# Patient Record
Sex: Male | Born: 1990
Health system: Southern US, Community
[De-identification: ages and names within clinical notes are randomized; demographics above are authoritative.]

## PROBLEM LIST (undated history)

## (undated) DIAGNOSIS — L270 Generalized skin eruption due to drugs and medicaments taken internally: Secondary | ICD-10-CM

## (undated) DIAGNOSIS — G4730 Sleep apnea, unspecified: Secondary | ICD-10-CM

## (undated) DIAGNOSIS — J3501 Chronic tonsillitis: Secondary | ICD-10-CM

## (undated) DIAGNOSIS — K219 Gastro-esophageal reflux disease without esophagitis: Secondary | ICD-10-CM

## (undated) HISTORY — PX: HERNIA REPAIR: SHX51

## (undated) HISTORY — PX: OTHER SURGICAL HISTORY: SHX169

---
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
## 2008-03-27 ENCOUNTER — Encounter (INDEPENDENT_AMBULATORY_CARE_PROVIDER_SITE_OTHER): Payer: Self-pay | Admitting: Family Medicine

## 2010-02-14 ENCOUNTER — Encounter: Payer: Self-pay | Admitting: Orthopedic Surgery

## 2010-02-14 ENCOUNTER — Encounter: Payer: Self-pay | Admitting: Otolaryngology

## 2011-02-28 ENCOUNTER — Emergency Department (HOSPITAL_COMMUNITY)
Admission: EM | Admit: 2011-02-28 | Discharge: 2011-02-28 | Disposition: A | Discharge status: Home / Self Care | Payer: Self-pay | Source: Home / Self Care | Attending: Emergency Medicine | Admitting: Emergency Medicine

## 2011-02-28 ENCOUNTER — Encounter (HOSPITAL_COMMUNITY): Payer: Self-pay | Admitting: Emergency Medicine

## 2011-02-28 DIAGNOSIS — A630 Anogenital (venereal) warts: Secondary | ICD-10-CM

## 2011-02-28 DIAGNOSIS — R5383 Other fatigue: Secondary | ICD-10-CM

## 2011-02-28 DIAGNOSIS — R5381 Other malaise: Secondary | ICD-10-CM

## 2011-02-28 HISTORY — DX: Morbid (severe) obesity due to excess calories: E66.01

## 2011-02-28 LAB — POCT I-STAT, CHEM 8
BUN: 15 mg/dL (ref 6–23)
Calcium, Ion: 1.16 mmol/L (ref 1.12–1.32)
Chloride: 103 mEq/L (ref 96–112)
Glucose, Bld: 92 mg/dL (ref 70–99)
Potassium: 4.3 mEq/L (ref 3.5–5.1)

## 2011-02-28 MED ORDER — PODOFILOX 0.5 % EX SOLN: CUTANEOUS | Status: DC

## 2011-02-28 NOTE — ED Provider Notes (Signed)
Chief Complaint  Patient presents with  . Dizziness  . Headache    History of Present Illness:   The patient has had a one-week history of headache, fatigue, weakness, dizziness, and nausea. Six months ago his father was diagnosed with cirrhosis of the liver. At that point he became concerned about his weight. He is morbidly obese and weighs over 300 pounds. He's been on a diet ever since then but has only managed to lose 25 pounds. He thought the weight wasn't coming off fast enough, so about a week ago he decided to go on a juice diet consisting only fruits and vegetable juices. He did not eat any meats or carbohydrates at all. Thereafter he began to experience the above-mentioned symptoms. He denies any fever, chills, nasal congestion, rhinorrhea, sore throat, shortness of breath, coughing, wheezing, chest pain, tightness, pressure, palpitations, syncope, abdominal pain, vomiting, or diarrhea. He has not had any urinary symptoms.    He is also concerned about some bumps on his penis.  He denies any discharge or ulcerations.  He requests testing for HIV and syphilis.  Review of Systems:  Other than noted above, the patient denies any of the following symptoms. Systemic:  No fever, chills, sweats, fatigue, myalgias, headache, or anorexia. Eye:  No redness, pain or drainage. ENT:  No earache, nasal congestion, rhinorrhea, sinus pressure, or sore throat. Lungs:  No cough, sputum production, wheezing, shortness of breath.  Cardiovascular:  No chest pain, palpitations, or syncope. GI:  No nausea, vomiting, abdominal pain or diarrhea. GU:  No dysuria, frequency, or hematuria. Skin:  No rash or pruritis.  PMFSH:  Past medical history, family history, social history, meds, and allergies were reviewed.  Physical Exam:   Vital signs:  BP 134/71  Pulse 66  Temp(Src) 98.5 F (36.9 C) (Oral)  Resp 17  SpO2 98% General:  Alert, in no distress. Eye:  PERRL, full EOMs.  Lids and conjunctivas were  normal. ENT:  TMs and canals were normal, without erythema or inflammation.  Nasal mucosa was clear and uncongested, without drainage.  Mucous membranes were moist.  Pharynx was clear, without exudate or drainage.  There were no oral ulcerations or lesions. Neck:  Supple, no adenopathy, tenderness or mass. Thyroid was normal. Lungs:  No respiratory distress.  Lungs were clear to auscultation, without wheezes, rales or rhonchi.  Breath sounds were clear and equal bilaterally. Heart:  Regular rhythm, without gallops, murmers or rubs. Abdomen:  Soft, flat, and non-tender to palpation.  No hepatosplenomagaly or mass. Genital exam:  He has several small bumps on the left side of the penis. Skin:  Clear, warm, and dry, without rash or lesions.  Labs:   Results for orders placed during the hospital encounter of 02/28/11  POCT I-STAT, CHEM 8      Component Value Range   Sodium 138  135 - 145 (mEq/L)   Potassium 4.3  3.5 - 5.1 (mEq/L)   Chloride 103  96 - 112 (mEq/L)   BUN 15  6 - 23 (mg/dL)   Creatinine, Ser 1.61  0.50 - 1.35 (mg/dL)   Glucose, Bld 92  70 - 99 (mg/dL)   Calcium, Ion 0.96  0.45 - 1.32 (mmol/L)   TCO2 28  0 - 100 (mmol/L)   Hemoglobin 16.0  13.0 - 17.0 (g/dL)   HCT 40.9  81.1 - 91.4 (%)     Radiology:  No results found.  Medications given in [facility]:  None  Assessment:   Diagnoses that  have been ruled out:  None  Diagnoses that are still under consideration:  None  Final diagnoses:  Fatigue  Condyloma acuminata   Medical Decision Making:  I think his fatigue and other symptoms are due to the drastic diet that he's on right now.  I advised him to eat a little better, but did encourage him to continue his efforts to lose weight.   Plan:   1.  The following meds were prescribed:   New Prescriptions   PODOFILOX (CONDYLOX) 0.5 % EXTERNAL SOLUTION    Apply BID to warts on penis for 3 days, stop for 4 days, repeat cycle for 4 weeks.   2.  The patient was instructed in  symptomatic care and handouts were given. 3.  The patient was told to return if becoming worse in any way, if no better in 3 or 4 days, and given some red flag symptoms that would indicate earlier return.    Roque Lias, MD 02/28/11 [phone]

## 2011-02-28 NOTE — ED Notes (Signed)
HERE WITH ONSET DIZZINESS AND H/A X 3 DYS AGO S/P DIETING.PT STATES HE HAS BEEN O DIET X 6 MNTHS BUT FOR 3 DYS HAS ONLY TAKEN VEGETABLE JUICERS AND NOW FEELS WEAK,FATIGUE AND NAUSEA.NO VOMITING OR DIARRHEA.PT ALSO REQUESTING STD CHECK.DENIES SX.

## 2011-03-08 NOTE — ED Notes (Signed)
Pt. Came with picture ID and was shown his neg. HIV and RPR result. Pt.instructed to get the HIV rechecked in 6 mos. Vassie Moselle 03/08/2011

## 2011-03-23 ENCOUNTER — Ambulatory Visit (INDEPENDENT_AMBULATORY_CARE_PROVIDER_SITE_OTHER): Payer: 59 | Admitting: Family Medicine

## 2011-03-23 ENCOUNTER — Encounter: Payer: Self-pay | Admitting: Family Medicine

## 2011-03-23 VITALS — BP 130/88 | HR 72 | Temp 98.7°F | Resp 12 | Ht 69.0 in

## 2011-03-23 DIAGNOSIS — B360 Pityriasis versicolor: Secondary | ICD-10-CM | POA: Insufficient documentation

## 2011-03-23 DIAGNOSIS — Z0000 Encounter for general adult medical examination without abnormal findings: Secondary | ICD-10-CM

## 2011-03-23 LAB — LIPID PANEL
HDL: 41.9 mg/dL (ref >39.00)
LDL Cholesterol: 77 mg/dL (ref 0–99)
Total CHOL/HDL Ratio: 3
Triglycerides: 66 mg/dL (ref 0.0–149.0)

## 2011-03-23 LAB — BASIC METABOLIC PANEL
CO2: 24 mEq/L (ref 19–32)
Calcium: 8.9 mg/dL (ref 8.4–10.5)
Chloride: 105 mEq/L (ref 96–112)
Creatinine, Ser: 0.8 mg/dL (ref 0.4–1.5)
Glucose, Bld: 85 mg/dL (ref 70–99)
Sodium: 139 mEq/L (ref 135–145)

## 2011-03-23 LAB — CBC WITH DIFFERENTIAL/PLATELET
Basophils Absolute: 0 10*3/uL (ref 0.0–0.1)
Eosinophils Relative: 2.1 % (ref 0.0–5.0)
HCT: 41.9 % (ref 39.0–52.0)
Lymphocytes Relative: 30.1 % (ref 12.0–46.0)
Lymphs Abs: 1.6 10*3/uL (ref 0.7–4.0)
Monocytes Relative: 8.4 % (ref 3.0–12.0)
Platelets: 246 10*3/uL (ref 150.0–400.0)
RDW: 12 % (ref 11.5–14.6)
WBC: 5.4 10*3/uL (ref 4.5–10.5)

## 2011-03-23 LAB — HEPATIC FUNCTION PANEL
AST: 26 U/L (ref 0–37)
Albumin: 4.1 g/dL (ref 3.5–5.2)
Alkaline Phosphatase: 67 U/L (ref 39–117)
Bilirubin, Direct: 0.1 mg/dL (ref 0.0–0.3)
Total Bilirubin: 0.7 mg/dL (ref 0.3–1.2)

## 2011-03-23 LAB — TSH: TSH: 1.82 u[IU]/mL (ref 0.35–5.50)

## 2011-03-23 MED ORDER — KETOCONAZOLE 200 MG PO TABS: ORAL_TABLET | ORAL | Status: DC

## 2011-03-23 NOTE — Progress Notes (Signed)
  Subjective:    Patient ID: Peter Sanders, male    DOB: Jun 04, 1990, 21 y.o.   MRN: [id number]  HPI  New patient to establish care and for wellness visit. Patient has history of morbid obesity. He had steady weight gain since high school.  Played high school football and no exercise until recently. Over the past couple months he is exercising 3-4 times per week and has scaled back calories and already lost about 15 pounds. Hopes to lose about 100 pounds altogether.  Past medical history reviewed. No chronic medical problems. Inguinal hernia repair in infancy otherwise no surgeries. Nonsmoker. Rare alcohol use.  Family history significant for father with history of alcoholism and hypertension. Mother type 2 diabetes. Maternal grandmother with breast cancer. Maternal grandfather with prostate cancer   Review of Systems  Constitutional: Negative for fever, activity change, appetite change, fatigue and unexpected weight change.  HENT: Negative for ear pain, congestion and trouble swallowing.   Eyes: Negative for pain and visual disturbance.  Respiratory: Negative for cough, shortness of breath and wheezing.   Cardiovascular: Negative for chest pain and palpitations.  Gastrointestinal: Negative for nausea, vomiting, abdominal pain, diarrhea, constipation, blood in stool, abdominal distention and rectal pain.  Genitourinary: Negative for dysuria, hematuria and testicular pain.  Musculoskeletal: Negative for joint swelling and arthralgias.  Skin: Positive for rash.  Neurological: Negative for dizziness, syncope and headaches.  Hematological: Negative for adenopathy.  Psychiatric/Behavioral: Negative for confusion and dysphoric mood.       Objective:   Physical Exam  Constitutional: He is oriented to person, place, and time.       Alert morbidly obese in no distress  HENT:  Right Ear: External ear normal.  Left Ear: External ear normal.  Mouth/Throat: Oropharynx is clear and moist.    Eyes: Pupils are equal, round, and reactive to light.  Neck: Neck supple. No thyromegaly present.  Cardiovascular: Normal rate and regular rhythm.   No murmur heard. Pulmonary/Chest: Effort normal and breath sounds normal. No respiratory distress. He has no wheezes. He has no rales.  Abdominal: Soft. He exhibits no distension and no mass. There is no tenderness. There is no rebound and no guarding.  Musculoskeletal: He exhibits no edema.  Lymphadenopathy:    He has no cervical adenopathy.  Neurological: He is alert and oriented to person, place, and time. No cranial nerve deficit.  Skin: Rash noted.       Diffuse rash mostly back region. Hyperpigmented and slightly scaly and macular  Psychiatric: He has a normal mood and affect. His behavior is normal. Judgment and thought content normal.          Assessment & Plan:  #1 complete physical. Discussed weight loss strategies. Continue exercise and calorie restriction. Reassess in 4 months. Obtain screening lab work. Tetanus up-to-date. #2 tinea versicolor. Discussed treatment options. We decided on ketoconazole 2 tablets x1 dose. He is aware this is likely to recur

## 2011-03-23 NOTE — Patient Instructions (Signed)
Tinea Versicolor Tinea versicolor is a common yeast infection of the skin. This condition becomes known when the yeast on our skin starts to overgrow (yeast is a normal inhabitant on our skin). This condition is noticed as white or light brown patches on brown skin, and is more evident in the summer on tanned skin. These areas are slightly scaly if scratched. The light patches from the yeast become evident when the yeast creates "holes in your suntan". This is most often noticed in the summer. The patches are usually located on the chest, back, pubis, neck and body folds. However, it may occur on any area of body. Mild itching and inflammation (redness or soreness) may be present. DIAGNOSIS  The diagnosisof this is made clinically (by looking). Cultures from samples are usually not needed. Examination under the microscope may help. However, yeast is normally found on skin. The diagnosis still remains clinical. Examination under Wood's Ultraviolet Light can determine the extent of the infection. TREATMENT  This common infection is usually only of cosmetic (only a concern to your appearance). It is easily treated with dandruff shampoo used during showers or bathing. Vigorous scrubbing will eliminate the yeast over several days time. The light areas in your skin may remain for weeks or months after the infection is cured unless your skin is exposed to sunlight. The lighter or darker spots caused by the fungus that remain after complete treatment are not a sign of treatment failure; it will take a long time to resolve. Your caregiver may recommend a number of commercial preparations or medication by mouth if home care is not working. Recurrence is common and preventative medication may be necessary. This skin condition is not highly contagious. Special care is not needed to protect close friends and family members. Normal hygiene is usually enough. Follow up is required only if you develop complications (such as a  secondary infection from scratching), if recommended by your caregiver, or if no relief is obtained from the preparations used. Document Released: 01/08/2000 Document Revised: 09/22/2010 Document Reviewed: 02/20/2008 [facility] Patient Information 2012 [place], [place].  Continue with weight loss efforts

## 2011-03-24 NOTE — Progress Notes (Signed)
Quick Note:  Pt informed ______ 

## 2011-06-25 DIAGNOSIS — J3501 Chronic tonsillitis: Secondary | ICD-10-CM

## 2011-06-25 HISTORY — DX: Chronic tonsillitis: J35.01

## 2011-07-04 ENCOUNTER — Emergency Department (HOSPITAL_COMMUNITY)
Admission: EM | Admit: 2011-07-04 | Discharge: 2011-07-04 | Disposition: A | Discharge status: Home / Self Care | Payer: 59 | Source: Home / Self Care | Attending: Emergency Medicine | Admitting: Emergency Medicine

## 2011-07-04 ENCOUNTER — Encounter (HOSPITAL_COMMUNITY): Payer: Self-pay | Admitting: Emergency Medicine

## 2011-07-04 DIAGNOSIS — J039 Acute tonsillitis, unspecified: Secondary | ICD-10-CM

## 2011-07-04 MED ORDER — AZITHROMYCIN 250 MG PO TABS: 250.0000 mg | ORAL_TABLET | Freq: Every day | ORAL | Status: AC

## 2011-07-04 MED ORDER — AZITHROMYCIN 250 MG PO TABS: 250.0000 mg | ORAL_TABLET | Freq: Every day | ORAL | Status: DC

## 2011-07-04 MED ORDER — OXYCODONE-ACETAMINOPHEN 5-325 MG PO TABS: 2.0000 | ORAL_TABLET | ORAL | Status: DC | PRN

## 2011-07-04 NOTE — ED Notes (Addendum)
Pt here with inflamed tonsils and swelling that has been ongoing since high school without surgery.states  scared to sleep because SHORT OF BREATH and AIRWAY COMPROMISE or eating x 1 month from swelling.no fever,chills,n,v noted.

## 2011-07-04 NOTE — ED Provider Notes (Signed)
Medical screening examination/treatment/procedure(s) were performed by non-physician practitioner and as supervising physician I was immediately available for consultation/collaboration.  Leslee Home, M.D.   Reuben Likes, MD 07/04/11 [phone]

## 2011-07-04 NOTE — Discharge Instructions (Signed)
Tonsillitis Tonsils are lumps of lymphoid tissues at the back of the throat. Each tonsil has 20 crevices (crypts). Tonsils help fight nose and throat infections and keep infection from spreading to other parts of the body for the first 18 months of life. Tonsillitis is an infection of the throat that causes the tonsils to become red, tender, and swollen. CAUSES Sudden and, if treated, temporary (acute) tonsillitis is usually caused by infection with streptococcal bacteria. Long lasting (chronic) tonsillitis occurs when the crypts of the tonsils become filled with pieces of food and bacteria, which makes it easy for the tonsils to become constantly infected. SYMPTOMS  Symptoms of tonsillitis include:  A sore throat.   White patches on the tonsils.   Fever.   Tiredness.  DIAGNOSIS Tonsillitis can be diagnosed through a physical exam. Diagnosis can be confirmed with the results of lab tests, including a throat culture. TREATMENT  The goals of tonsillitis treatment include the reduction of the severity and duration of symptoms, prevention of associated conditions, and prevention of disease transmission. Tonsillitis caused by bacteria can be treated with antibiotics. Usually, treatment with antibiotics is started before the cause of the tonsillitis is known. However, if it is determined that the cause is not bacterial, antibiotics will not treat the tonsillitis. If attacks of tonsillitis are severe and frequent, your caregiver may recommend surgery to remove the tonsils (tonsillectomy). HOME CARE INSTRUCTIONS   Rest as much as possible and get plenty of sleep.   Drink plenty of fluids. While the throat is very sore, eat soft foods or liquids, such as sherbet, soups, or instant breakfast drinks.   Eat frozen ice pops.   Older children and adults may gargle with a warm or cold liquid to help soothe the throat. Mix 1 teaspoon of salt in 1 cup of water.   Other family members who also develop a  sore throat or fever should have a medical exam or throat culture.   Only take over-the-counter or prescription medicines for pain, discomfort, or fever as directed by your caregiver.   If you are given antibiotics, take them as directed. Finish them even if you start to feel better.  SEEK MEDICAL CARE IF:   Your baby is older than 3 months with a rectal temperature of 100.5 F (38.1 C) or higher for more than 1 day.   Large, tender lumps develop in your neck.   A rash develops.   Green, yellow-brown, or bloody substance is coughed up.   You are unable to swallow liquids or food for 24 hours.   Your child is unable to swallow food or liquids for 12 hours.  SEEK IMMEDIATE MEDICAL CARE IF:   You develop any new symptoms such as vomiting, severe headache, stiff neck, chest pain, or trouble breathing or swallowing.   You have severe throat pain along with drooling or voice changes.   You have severe pain, unrelieved with recommended medications.   You are unable to fully open the mouth.   You develop redness, swelling, or severe pain anywhere in the neck.   You have a fever.   Your baby is older than 3 months with a rectal temperature of 102 F (38.9 C) or higher.   Your baby is 12 months old or younger with a rectal temperature of 100.4 F (38 C) or higher.  MAKE SURE YOU:   Understand these instructions.   Will watch your condition.   Will get help right away if you are not  watch your condition.   Will get help right away if you are not doing well or get worse.  Document Released: 10/20/2004 Document Revised: 12/30/2010 Document Reviewed: 03/18/2010  [employer] Patient Information 2012 [employer].

## 2011-07-04 NOTE — ED Provider Notes (Signed)
History     CSN: [id number]  Arrival date & time 07/04/11  1144   First MD Initiated Contact with Patient 07/04/11 1344      Chief Complaint  Patient presents with  . Sore Throat    (Consider location/radiation/quality/duration/timing/severity/associated sxs/prior treatment) Patient is a 21 y.o. male presenting with pharyngitis. The history is provided by the patient. No language interpreter was used.  Sore Throat This is a new problem. The current episode started more than 1 week ago. The problem occurs constantly. The problem has been gradually worsening. The symptoms are aggravated by nothing. The symptoms are relieved by nothing. He has tried nothing for the symptoms. The treatment provided no relief.  Pt complains of swollen tonsils.   Pt reports pain with swallowing.  Past Medical History  Diagnosis Date  . Morbid obesity     Past Surgical History  Procedure Date  . Hernia repair     31 months old    Family History  Problem Relation Age of Onset  . Diabetes Mother     type ll  . Drug abuse Father   . Hypertension Father   . Drug abuse Brother   . Cancer Maternal Grandmother     breast  . Kidney disease Maternal Grandmother   . Alcohol abuse Maternal Grandfather   . Cancer Maternal Grandfather     prostate  . Hypertension Paternal Grandfather   . Diabetes Paternal Grandfather     History  Substance Use Topics  . Smoking status: Never Smoker   . Smokeless tobacco: Not on file  . Alcohol Use: Yes      Review of Systems  HENT: Positive for sore throat.   All other systems reviewed and are negative.    Allergies  Hydrocodone and Penicillins  Home Medications   Current Outpatient Rx  Name Route Sig Dispense Refill  . KETOCONAZOLE 200 MG PO TABS  2 tablets po times one dose as needed for tinea versicolor 4 tablet 1  . PODOFILOX 0.5 % EX SOLN  Apply BID to warts on penis for 3 days, stop for 4 days, repeat cycle for 4 weeks. 3.5 mL 4    BP 152/92   Pulse 75  Temp(Src) 99 F (37.2 C) (Oral)  Resp 18  SpO2 99%  Physical Exam  Nursing note and vitals reviewed. Constitutional: He appears well-developed and well-nourished.  HENT:  Head: Normocephalic.  Eyes: Conjunctivae are normal. Pupils are equal, round, and reactive to light.  Neck: Normal range of motion. Neck supple.  Cardiovascular: Normal rate and regular rhythm.   Pulmonary/Chest: Effort normal and breath sounds normal.  Abdominal: Soft. Bowel sounds are normal.  Musculoskeletal: Normal range of motion.  Neurological: He is alert.  Skin: Skin is warm.  Psychiatric: He has a normal mood and affect.    ED Course  Procedures (including critical care time)  Labs Reviewed - No data to display No results found.   No diagnosis found.    MDM  RX for zpack, hydrocodone  I advised call Dr. Emeline Darling to be seen for evaluation of tonsils        Lonia Skinner [place], [place] 07/04/11 1349

## 2011-07-06 DIAGNOSIS — L270 Generalized skin eruption due to drugs and medicaments taken internally: Secondary | ICD-10-CM

## 2011-07-06 HISTORY — DX: Generalized skin eruption due to drugs and medicaments taken internally: L27.0

## 2011-07-12 ENCOUNTER — Encounter (HOSPITAL_BASED_OUTPATIENT_CLINIC_OR_DEPARTMENT_OTHER): Payer: Self-pay | Admitting: *Deleted

## 2011-07-12 NOTE — Pre-Procedure Instructions (Signed)
Discussed BMI and sx. of sleep apnea with Dr. Chaney Malling; will evaluate pt. DOS for need to stay overnight; pt. OK to come for surg.

## 2011-07-15 ENCOUNTER — Encounter (HOSPITAL_BASED_OUTPATIENT_CLINIC_OR_DEPARTMENT_OTHER): Payer: Self-pay | Admitting: *Deleted

## 2011-07-18 ENCOUNTER — Encounter (HOSPITAL_BASED_OUTPATIENT_CLINIC_OR_DEPARTMENT_OTHER): Payer: Self-pay | Admitting: Anesthesiology

## 2011-07-18 ENCOUNTER — Ambulatory Visit (HOSPITAL_BASED_OUTPATIENT_CLINIC_OR_DEPARTMENT_OTHER): Payer: 59 | Admitting: Anesthesiology

## 2011-07-18 ENCOUNTER — Encounter (HOSPITAL_BASED_OUTPATIENT_CLINIC_OR_DEPARTMENT_OTHER)
Admission: RE | Disposition: A | Discharge status: Home / Self Care | Payer: Self-pay | Source: Ambulatory Visit | Attending: Otolaryngology

## 2011-07-18 ENCOUNTER — Ambulatory Visit (HOSPITAL_BASED_OUTPATIENT_CLINIC_OR_DEPARTMENT_OTHER)
Admission: RE | Admit: 2011-07-18 | Discharge: 2011-07-19 | Disposition: A | Discharge status: Home / Self Care | Payer: 59 | Source: Ambulatory Visit | Attending: Otolaryngology | Admitting: Otolaryngology

## 2011-07-18 DIAGNOSIS — G4730 Sleep apnea, unspecified: Secondary | ICD-10-CM | POA: Insufficient documentation

## 2011-07-18 DIAGNOSIS — K219 Gastro-esophageal reflux disease without esophagitis: Secondary | ICD-10-CM | POA: Insufficient documentation

## 2011-07-18 DIAGNOSIS — J351 Hypertrophy of tonsils: Secondary | ICD-10-CM

## 2011-07-18 DIAGNOSIS — J3501 Chronic tonsillitis: Secondary | ICD-10-CM | POA: Insufficient documentation

## 2011-07-18 HISTORY — DX: Chronic tonsillitis: J35.01

## 2011-07-18 HISTORY — DX: Sleep apnea, unspecified: G47.30

## 2011-07-18 HISTORY — DX: Generalized skin eruption due to drugs and medicaments taken internally: L27.0

## 2011-07-18 HISTORY — DX: Gastro-esophageal reflux disease without esophagitis: K21.9

## 2011-07-18 HISTORY — PX: TONSILLECTOMY: SHX5217

## 2011-07-18 SURGERY — TONSILLECTOMY
Anesthesia: General | Site: Throat | Laterality: Bilateral | Wound class: Clean Contaminated

## 2011-07-18 MED ORDER — HYDROMORPHONE HCL PF 1 MG/ML IJ SOLN: 0.2500 mg | INTRAMUSCULAR | Status: DC | PRN

## 2011-07-18 MED ORDER — VITAMIN E 45 MG (100 UNIT) PO CAPS: 200.0000 [IU] | ORAL_CAPSULE | Freq: Every day | ORAL | Status: DC

## 2011-07-18 MED ORDER — MIDAZOLAM HCL 2 MG/2ML IJ SOLN: 0.5000 mg | Freq: Once | INTRAMUSCULAR | Status: AC | PRN

## 2011-07-18 MED ORDER — DIPHENHYDRAMINE HCL 25 MG PO TABS: 25.0000 mg | ORAL_TABLET | Freq: Four times a day (QID) | ORAL | Status: DC | PRN

## 2011-07-18 MED ORDER — PROPOFOL 10 MG/ML IV EMUL
INTRAVENOUS | Status: DC | PRN
  Administered 2011-07-18: 400 mg via INTRAVENOUS

## 2011-07-18 MED ORDER — ACETAMINOPHEN 650 MG RE SUPP: 650.0000 mg | RECTAL | Status: DC | PRN

## 2011-07-18 MED ORDER — FENTANYL 50 MCG/HR TD PT72: 50.0000 ug | MEDICATED_PATCH | TRANSDERMAL | Status: DC

## 2011-07-18 MED ORDER — PROMETHAZINE HCL 25 MG/ML IJ SOLN: 6.2500 mg | INTRAMUSCULAR | Status: DC | PRN

## 2011-07-18 MED ORDER — KCL IN DEXTROSE-NACL 20-5-0.45 MEQ/L-%-% IV SOLN
INTRAVENOUS | Status: DC
Start: 2011-07-18 — End: 2011-07-19
  Administered 2011-07-18 – 2011-07-19 (×3): via INTRAVENOUS

## 2011-07-18 MED ORDER — MIDAZOLAM HCL 5 MG/5ML IJ SOLN
INTRAMUSCULAR | Status: DC | PRN
  Administered 2011-07-18: 2 mg via INTRAVENOUS

## 2011-07-18 MED ORDER — DEXAMETHASONE SODIUM PHOSPHATE 4 MG/ML IJ SOLN
INTRAMUSCULAR | Status: DC | PRN
  Administered 2011-07-18: 10 mg via INTRAVENOUS

## 2011-07-18 MED ORDER — ONDANSETRON HCL 4 MG/2ML IJ SOLN
INTRAMUSCULAR | Status: DC | PRN
  Administered 2011-07-18: 4 mg via INTRAVENOUS

## 2011-07-18 MED ORDER — ACETAMINOPHEN 10 MG/ML IV SOLN
1000.0000 mg | Freq: Once | INTRAVENOUS | Status: AC
  Administered 2011-07-18: 1000 mg via INTRAVENOUS

## 2011-07-18 MED ORDER — MEPERIDINE HCL 25 MG/ML IJ SOLN: 6.2500 mg | INTRAMUSCULAR | Status: DC | PRN

## 2011-07-18 MED ORDER — ACETAMINOPHEN 160 MG/5ML PO SOLN: 650.0000 mg | ORAL | Status: DC | PRN

## 2011-07-18 MED ORDER — LIDOCAINE HCL (CARDIAC) 20 MG/ML IV SOLN
INTRAVENOUS | Status: DC | PRN
  Administered 2011-07-18: 75 mg via INTRAVENOUS

## 2011-07-18 MED ORDER — LORATADINE 10 MG PO TABS: 10.0000 mg | ORAL_TABLET | Freq: Every day | ORAL | Status: DC

## 2011-07-18 MED ORDER — MORPHINE SULFATE 2 MG/ML IJ SOLN: 2.0000 mg | INTRAMUSCULAR | Status: DC | PRN

## 2011-07-18 MED ORDER — PROMETHAZINE HCL 25 MG RE SUPP: 25.0000 mg | Freq: Four times a day (QID) | RECTAL | Status: DC | PRN

## 2011-07-18 MED ORDER — IBUPROFEN 600 MG PO TABS
600.0000 mg | ORAL_TABLET | Freq: Four times a day (QID) | ORAL | Status: DC | PRN
  Administered 2011-07-18 – 2011-07-19 (×3): 600 mg via ORAL

## 2011-07-18 MED ORDER — PROMETHAZINE HCL 25 MG PO TABS: 25.0000 mg | ORAL_TABLET | Freq: Four times a day (QID) | ORAL | Status: DC | PRN

## 2011-07-18 MED ORDER — LACTATED RINGERS IV SOLN
INTRAVENOUS | Status: DC
  Administered 2011-07-18: 10:00:00 via INTRAVENOUS

## 2011-07-18 MED ORDER — SUCCINYLCHOLINE CHLORIDE 20 MG/ML IJ SOLN
INTRAMUSCULAR | Status: DC | PRN
  Administered 2011-07-18: 200 mg via INTRAVENOUS

## 2011-07-18 MED ORDER — TRAMADOL HCL 50 MG PO TABS
100.0000 mg | ORAL_TABLET | Freq: Four times a day (QID) | ORAL | Status: DC | PRN
  Administered 2011-07-18 – 2011-07-19 (×4): 100 mg via ORAL

## 2011-07-18 MED ORDER — FENTANYL CITRATE 0.05 MG/ML IJ SOLN
INTRAMUSCULAR | Status: DC | PRN
  Administered 2011-07-18: 50 ug via INTRAVENOUS

## 2011-07-18 MED ORDER — FAMOTIDINE 20 MG PO TABS: 20.0000 mg | ORAL_TABLET | Freq: Every day | ORAL | Status: DC

## 2011-07-18 SURGICAL SUPPLY — 28 items
CANISTER SUCTION 1200CC (MISCELLANEOUS) ×2 IMPLANT
CATH ROBINSON RED A/P 10FR (CATHETERS) IMPLANT
CATH ROBINSON RED A/P 14FR (CATHETERS) IMPLANT
CLEANER CAUTERY TIP 5X5 PAD (MISCELLANEOUS) IMPLANT
CLOTH BEACON ORANGE TIMEOUT ST (SAFETY) ×2 IMPLANT
COAGULATOR SUCT SWTCH 10FR 6 (ELECTROSURGICAL) ×2 IMPLANT
COVER MAYO STAND STRL (DRAPES) ×2 IMPLANT
ELECT COATED BLADE 2.86 ST (ELECTRODE) ×2 IMPLANT
ELECT REM PT RETURN 9FT ADLT (ELECTROSURGICAL) ×2
ELECT REM PT RETURN 9FT PED (ELECTROSURGICAL)
ELECTRODE REM PT RETRN 9FT PED (ELECTROSURGICAL) IMPLANT
ELECTRODE REM PT RTRN 9FT ADLT (ELECTROSURGICAL) ×1 IMPLANT
GAUZE SPONGE 4X4 12PLY STRL LF (GAUZE/BANDAGES/DRESSINGS) ×2 IMPLANT
GLOVE BIO SURGEON STRL SZ7.5 (GLOVE) ×2 IMPLANT
GLOVE ECLIPSE 6.5 STRL STRAW (GLOVE) ×4 IMPLANT
GOWN PREVENTION PLUS XLARGE (GOWN DISPOSABLE) ×4 IMPLANT
NS IRRIG 1000ML POUR BTL (IV SOLUTION) ×2 IMPLANT
PAD CLEANER CAUTERY TIP 5X5 (MISCELLANEOUS)
PENCIL FOOT CONTROL (ELECTRODE) ×2 IMPLANT
SHEET MEDIUM DRAPE 40X70 STRL (DRAPES) ×2 IMPLANT
SOLUTION BUTLER CLEAR DIP (MISCELLANEOUS) ×2 IMPLANT
SPONGE TONSIL 1 RF SGL (DISPOSABLE) IMPLANT
SPONGE TONSIL 1.25 RF SGL STRG (GAUZE/BANDAGES/DRESSINGS) IMPLANT
SYR BULB 3OZ (MISCELLANEOUS) ×2 IMPLANT
TOWEL OR 17X24 6PK STRL BLUE (TOWEL DISPOSABLE) ×2 IMPLANT
TUBE CONNECTING 20X1/4 (TUBING) ×2 IMPLANT
TUBE SALEM SUMP 16 FR W/ARV (TUBING) ×2 IMPLANT
WATER STERILE IRR 1000ML POUR (IV SOLUTION) IMPLANT

## 2011-07-18 NOTE — Anesthesia Postprocedure Evaluation (Signed)
  Anesthesia Post-op Note  Patient: Scientific laboratory technician  Procedure(s) Performed: Procedure(s) (LRB): TONSILLECTOMY (Bilateral)  Patient Location: PACU  Anesthesia Type: General  Level of Consciousness: awake, alert  and oriented  Airway and Oxygen Therapy: Patient Spontanous Breathing  Post-op Pain: mild  Post-op Assessment: Post-op Vital signs reviewed, Patient's Cardiovascular Status Stable, Respiratory Function Stable, Patent Airway, No signs of Nausea or vomiting and Pain level controlled  Post-op Vital Signs: Reviewed and stable  Complications: No apparent anesthesia complications

## 2011-07-18 NOTE — Anesthesia Preprocedure Evaluation (Signed)
Anesthesia Evaluation  Patient identified by MRN, date of birth, ID band Patient awake    Reviewed: Allergy & Precautions, H&P , NPO status , Patient's Chart, lab work & pertinent test results  History of Anesthesia Complications Negative for: history of anesthetic complications  Airway Mallampati: I TM Distance: >3 FB Neck ROM: Full    Dental No notable dental hx. (+) Teeth Intact and Dental Advisory Given   Pulmonary sleep apnea (no CPAP) ,  breath sounds clear to auscultation  Pulmonary exam normal       Cardiovascular negative cardio ROS  Rhythm:Regular Rate:Normal     Neuro/Psych negative neurological ROS     GI/Hepatic Neg liver ROS, GERD-  Medicated and Controlled,  Endo/Other  Morbid obesity  Renal/GU negative Renal ROS     Musculoskeletal   Abdominal (+) + obese,   Peds  Hematology negative hematology ROS (+)   Anesthesia Other Findings   Reproductive/Obstetrics                           Anesthesia Physical Anesthesia Plan  ASA: III  Anesthesia Plan: General   Post-op Pain Management:    Induction: Intravenous  Airway Management Planned: Oral ETT  Additional Equipment:   Intra-op Plan:   Post-operative Plan: Extubation in OR  Informed Consent: I have reviewed the patients History and Physical, chart, labs and discussed the procedure including the risks, benefits and alternatives for the proposed anesthesia with the patient or authorized representative who has indicated his/her understanding and acceptance.   Dental advisory given  Plan Discussed with: Surgeon and CRNA  Anesthesia Plan Comments: (Plan routine monitors, GETA)        Anesthesia Quick Evaluation

## 2011-07-18 NOTE — Anesthesia Procedure Notes (Signed)
Procedure Name: Intubation Date/Time: 07/18/2011 11:08 AM Performed by: Zenia Resides D Pre-anesthesia Checklist: Patient identified, Emergency Drugs available, Suction available, Patient being monitored and Timeout performed Patient Re-evaluated:Patient Re-evaluated prior to inductionOxygen Delivery Method: Circle System Utilized Preoxygenation: Pre-oxygenation with 100% oxygen Intubation Type: IV induction Ventilation: Mask ventilation without difficulty Laryngoscope Size: Mac and 3 Grade View: Grade I Tube type: Oral Tube size: 8.0 mm Number of attempts: 1 Airway Equipment and Method: stylet and oral airway Placement Confirmation: ETT inserted through vocal cords under direct vision,  positive ETCO2 and breath sounds checked- equal and bilateral Secured at: 24 cm Tube secured with: Tape Dental Injury: Teeth and Oropharynx as per pre-operative assessment

## 2011-07-18 NOTE — H&P (Signed)
Peter Sanders is an 21 y.o. male.   Chief Complaint: tonsillar hypertrophy, chronic tonsillitis HPI: 21 year old with chronic tonsil swelling and infection and bad snoring with witnessed apnea.  Presents for tonsillectomy.  Past Medical History  Diagnosis Date  . Morbid obesity   . GERD (gastroesophageal reflux disease)     OTC as needed  . Chronic tonsillitis 06/2011  . Allergic drug rash 07/06/2011    after taking Percocet  . Sleep apnea     snores, has apnea and wakes up gasping; states due to tonsil problem, has not had sleep study    Past Surgical History  Procedure Date  . Hernia repair     as an infant    Family History  Problem Relation Age of Onset  . Diabetes Mother     type ll  . Drug abuse Father   . Hypertension Father   . Drug abuse Brother   . Cancer Maternal Grandmother     breast  . Kidney disease Maternal Grandmother   . Alcohol abuse Maternal Grandfather   . Cancer Maternal Grandfather     prostate  . Hypertension Paternal Grandfather   . Diabetes Paternal Grandfather    Social History:  reports that he has never smoked. He has never used smokeless tobacco. He reports that he drinks alcohol. He reports that he does not use illicit drugs.  Allergies:  Allergies  Allergen Reactions  . Hydrocodone Hives    BURNING OF SKIN  . Penicillins Hives  . Percocet (Oxycodone-Acetaminophen) Hives    BURNING OF SKIN    Medications Prior to Admission  Medication Sig Dispense Refill  . cetirizine (ZYRTEC) 10 MG tablet Take 10 mg by mouth daily.      . diphenhydrAMINE (BENADRYL) 25 MG tablet Take 25 mg by mouth every 6 (six) hours as needed.      . famotidine (PEPCID) 20 MG tablet Take 20 mg by mouth daily.      . vitamin E 200 UNIT capsule Take 200 Units by mouth daily.      . podofilox (CONDYLOX) 0.5 % external solution Apply BID to warts on penis for 3 days, stop for 4 days, repeat cycle for 4 weeks.  3.5 mL  4    No results found for this or any  previous visit (from the past 48 hour(s)). No results found.  Review of Systems  All other systems reviewed and are negative.    Blood pressure 140/80, pulse 71, temperature 98.1 F (36.7 C), temperature source Oral, resp. rate 20, height 5\' 9"  (1.753 m), weight 158.759 kg (350 lb), SpO2 99.00%. Physical Exam  Constitutional: He is oriented to person, place, and time. He appears well-developed and well-nourished. No distress.  HENT:  Head: Normocephalic and atraumatic.  Right Ear: External ear normal.  Left Ear: External ear normal.  Nose: Nose normal.  Mouth/Throat: Oropharynx is clear and moist.       Tonsils 4+.  Muffled voice.  Eyes: Conjunctivae and EOM are normal. Pupils are equal, round, and reactive to light.  Neck: Normal range of motion. Neck supple.  Cardiovascular: Normal rate.   Respiratory: Effort normal.  GI:       Did not examine.  Genitourinary:       Did not examine.  Musculoskeletal: Normal range of motion.  Neurological: He is alert and oriented to person, place, and time. No cranial nerve deficit.  Skin: Skin is warm and dry.  Psychiatric: He has a normal mood  and affect. His behavior is normal. Judgment and thought content normal.     Assessment/Plan Tonsillar hypertrophy, chronic tonsillitis To OR for tonsillectomy.  Plan overnight stay in [facility] for postop pain control.  Allergic to oral narcotic pain meds.  Feleshia Zundel 07/18/2011, 10:44 AM

## 2011-07-18 NOTE — Transfer of Care (Signed)
Immediate Anesthesia Transfer of Care Note  Patient: Peter Sanders  Procedure(s) Performed: Procedure(s) (LRB): TONSILLECTOMY (Bilateral)  Patient Location: PACU  Anesthesia Type: General  Level of Consciousness: awake, alert  and oriented  Airway & Oxygen Therapy: Patient Spontanous Breathing and Patient connected to face mask oxygen  Post-op Assessment: Report given to PACU RN and Post -op Vital signs reviewed and stable  Post vital signs: Reviewed and stable  Complications: No apparent anesthesia complications

## 2011-07-18 NOTE — Op Note (Signed)
Peter Sanders, Peter Sanders NO.:  [id number]  MEDICAL RECORD NO.:  [id number]  LOCATION:  [facility]                         FACILITY:  [facility]  PHYSICIAN:  Antony Contras, MD     DATE OF BIRTH:  01-03-1991  DATE OF PROCEDURE:  07/18/2011 DATE OF DISCHARGE:  07/04/2011                              OPERATIVE REPORT   PREOPERATIVE DIAGNOSES: 1. Tonsillar hypertrophy. 2. Chronic tonsillitis.  POSTOPERATIVE DIAGNOSES: 1. Tonsillar hypertrophy. 2. Chronic tonsillitis.  PROCEDURE:  Tonsillectomy.  SURGEON:  Antony Contras, MD  ANESTHESIA:  General endotracheal anesthesia.  COMPLICATIONS:  None.  INDICATION:  The patient is a 21 year old male who has had recurring strep throat and tonsillitis since high school.  His tonsils swell up and want to go back down.  He snores loudly and has had witnessed apnea. He drools during the day.  His voice sounds muffled.  He was found to have 4+ tonsils and presents to the operating room for surgical management.  FINDINGS:  Tonsils were 4+.  There was mild cryptic inflammation.  DESCRIPTION OF PROCEDURE:  The patient was identified in the holding room and informed consent having been obtained with discussion of risks, benefits, and alternatives, the patient was brought to the operative suite and placed on the operating table in supine position.  Anesthesia was induced and the patient was intubated by anesthesia team without difficulty.  The patient was given intravenous steroids during the case. The eyes were taped and closed and bed was turned 90 degrees from anesthesia.  Head wrap was placed around the patient's head.  A Crowe- Davis retractor was inserted in the mouth and opened via the oropharynx. This was placed in suspension on Mayo stand.  The right tonsil was grasped with a curved Allis and retracted medially while a curvilinear incision was made along the anterior tonsillar pillar using Bovie cautery on a setting of 20.   Dissection continued in the subcapsular plane until tonsil was removed.  The same procedure was then carried on the left side.  Tonsils were passed separately for pathology.  Bleeding was controlled with suction cautery on a setting of 30.  The nose and throat were copiously irrigated with saline and a flexible catheter was passed down the esophagus secondary to the stomach and esophagus.  The retractor was taken out of suspension and removed from the patient's mouth.  He was turned back to Anesthesia for wake up, was extubated, moved to the recovery room in stable condition.     Antony Contras, MD     DDB/MEDQ  D:  07/18/2011  T:  07/18/2011  Job:  [id number]

## 2011-07-18 NOTE — Brief Op Note (Signed)
07/18/2011  11:22 AM  PATIENT:  Peter Sanders  21 y.o. male  PRE-OPERATIVE DIAGNOSIS:  chronic tonsillitis, tonsillar hypertrophy  POST-OPERATIVE DIAGNOSIS:  chronic tonsillitis, tonsillar hypertrophy  PROCEDURE:  Procedure(s) (LRB): TONSILLECTOMY (Bilateral)  SURGEON:  Surgeon(s) and Role:    * Christia Reading, MD - Primary  PHYSICIAN ASSISTANT:   ASSISTANTS: none   ANESTHESIA:   general  EBL:     BLOOD ADMINISTERED:none  DRAINS: none   LOCAL MEDICATIONS USED:  NONE  SPECIMEN:  Source of Specimen:  right and left tonsil  DISPOSITION OF SPECIMEN:  PATHOLOGY  COUNTS:  YES  TOURNIQUET:  * No tourniquets in log *  DICTATION: .Other Dictation: Dictation Number [id number]  PLAN OF CARE: Admit for overnight observation  PATIENT DISPOSITION:  PACU - hemodynamically stable.   Delay start of Pharmacological VTE agent (>24hrs) due to surgical blood loss or risk of bleeding: yes

## 2011-07-19 LAB — POCT HEMOGLOBIN-HEMACUE: Hemoglobin: 11.7 g/dL — ABNORMAL LOW (ref 13.0–17.0)

## 2011-07-19 MED ORDER — TRAMADOL HCL 50 MG PO TABS: 100.0000 mg | ORAL_TABLET | Freq: Four times a day (QID) | ORAL | Status: AC | PRN

## 2011-07-19 MED ORDER — IBUPROFEN 600 MG PO TABS: 600.0000 mg | ORAL_TABLET | Freq: Four times a day (QID) | ORAL | Status: AC | PRN

## 2011-07-19 NOTE — Discharge Summary (Signed)
Physician Discharge Summary  Patient ID: Peter Sanders MRN: [id number] DOB/AGE: [birth year] 23 y.o.  Admit date: 07/18/2011 Discharge date: 07/19/2011  Admission Diagnoses: Tonsillar hypertrophy, chronic tonsillitis  Discharge Diagnoses: Same Active Problems:  * No active hospital problems. *    Discharged Condition: good  Hospital Course: 21 year old underwent tonsillectomy for tonsillar hypertrophy and chronic tonsillitis.  Observed overnight due to obesity and multiple medication allergies.  Did well overnight with pain controlled on Motrin and Ultram.  Drinking well this morning.  No significant bleeding.  Felt stable for discharge.  Consults: None  Significant Diagnostic Studies: None  Treatments: surgery: Tonsillectomy  Discharge Exam: Blood pressure 126/91, pulse 65, temperature 97.8 F (36.6 C), temperature source Oral, resp. rate 16, height 5\' 9"  (1.753 m), weight 158.759 kg (350 lb), SpO2 99.00%. General appearance: alert, cooperative and no distress Throat: No throat bleeding.  Disposition: 01-Home or Self Care  Discharge Orders    Future Appointments: Provider: Department: Dept Phone: Center:   07/21/2011 10:00 AM Kristian Covey, MD [facility] [phone] [facility]     Future Orders Please Complete By Expires   Diet - low sodium heart healthy      Increase activity slowly      Discharge instructions      Comments:   Drink plenty of fluids.  Avoid strenuous activity for two weeks.  Low-grade fever, ear pain, bad breath are normal.  Call with high fever, inability to swallow, or bleeding.     Medication List  As of 07/19/2011  8:27 AM   TAKE these medications         cetirizine 10 MG tablet   Commonly known as: ZYRTEC   Take 10 mg by mouth daily.      diphenhydrAMINE 25 MG tablet   Commonly known as: BENADRYL   Take 25 mg by mouth every 6 (six) hours as needed.      famotidine 20 MG tablet   Commonly known as: PEPCID   Take 20 mg by mouth daily.       ibuprofen 600 MG tablet   Commonly known as: ADVIL,MOTRIN   Take 1 tablet (600 mg total) by mouth every 6 (six) hours as needed for pain or fever.      podofilox 0.5 % external solution   Commonly known as: CONDYLOX   Apply BID to warts on penis for 3 days, stop for 4 days, repeat cycle for 4 weeks.      traMADol 50 MG tablet   Commonly known as: ULTRAM   Take 2 tablets (100 mg total) by mouth every 6 (six) hours as needed for pain.      vitamin E 200 UNIT capsule   Take 200 Units by mouth daily.           Follow-up Information    Follow up with Adal Sereno, MD. Schedule an appointment as soon as possible for a visit in 4 weeks.   Contact information:   [facility] [address], [address] [place] [address] [phone]          Signed: Christia Reading 07/19/2011, 8:27 AM

## 2011-07-20 ENCOUNTER — Encounter (HOSPITAL_BASED_OUTPATIENT_CLINIC_OR_DEPARTMENT_OTHER): Payer: Self-pay | Admitting: Otolaryngology

## 2011-07-21 ENCOUNTER — Ambulatory Visit: Payer: 59 | Admitting: Family Medicine

## 2011-07-22 NOTE — Op Note (Signed)
Peter Sanders, Peter Sanders             ACCOUNT NO.:  [id number]  MEDICAL RECORD NO.:  [id number]  LOCATION:                                 FACILITY:  PHYSICIAN:  Antony Contras, MD     DATE OF BIRTH:  Dec 10, 1990  DATE OF PROCEDURE:  07/18/2011 DATE OF DISCHARGE:                              OPERATIVE REPORT   PREOPERATIVE DIAGNOSIS:  Tonsillar hypertrophy and chronic tonsillitis.  POSTOPERATIVE DIAGNOSIS:  Tonsillar hypertrophy and chronic tonsillitis.  PROCEDURE:  Tonsillectomy.  SURGEON:  Antony Contras, MD  ANESTHESIA:  General endotracheal anesthesia.  COMPLICATIONS:  None.  INDICATION:  The patient is a 21 year old male, who has a long history of obstructive symptoms due to enlarged tonsils that are frequently inflamed.  He presents to the operating room for surgical management.  FINDINGS:  Tonsils were 4+ in size.  DESCRIPTION OF PROCEDURE:  The patient was identified in the holding room and informed consent having been obtained.  After discussion of risks, benefits, alternatives, the patient brought to the office suite, and put on the operative table in supine position.  Anesthesia was induced.  The patient was intubated by the anesthesia team without difficulty.  The eyes were taped closed.  The bed was turned 90 degrees from anesthesia.  The patient was given intravenous steroids during the case.  A head wrap was placed around the patient's head and a Crowe- Davis retractor was inserted in the mouth and opened via the oropharynx. This placed in suspension on the Mayo stand.  The right tonsil was grasped with curved Allis retracted medially, while a curvilinear incision was made along the anterior tonsillar pillar using Bovie cautery on a setting of 20.  Dissection continued in the subcapsular plane until the tonsil was removed.  The same procedure was then carried on the left side.  Tonsils were passed separately for pathology. Bleeding was controlled on both sides  using suction cautery on a setting of 30.  After this was completed, the nose and throat were copiously irrigated with saline and a flexible catheter was passed down the esophagus to suck out the stomach and esophagus.  Retractor was taken out of suspension and removed from the patient's mouth.  He was turned back to Anesthesia for wake up.  He was extubated and moved to recovery room in stable condition.     Antony Contras, MD     DDB/MEDQ  D:  07/21/2011  T:  07/22/2011  Job:  [id number]

## 2011-08-25 ENCOUNTER — Telehealth: Payer: Self-pay | Admitting: *Deleted

## 2011-08-25 ENCOUNTER — Ambulatory Visit: Payer: 59 | Admitting: Family Medicine

## 2011-08-25 NOTE — Telephone Encounter (Signed)
Called pt phone, left a message that we had him scheduled for 4 month follow-up and he was a no-show.  I requested he call with explaination.

## 2012-06-08 ENCOUNTER — Telehealth: Payer: Self-pay | Admitting: Family Medicine

## 2012-06-08 DIAGNOSIS — Z299 Encounter for prophylactic measures, unspecified: Secondary | ICD-10-CM

## 2012-06-08 NOTE — Telephone Encounter (Signed)
Patient called stating that he would like a full std testing to include hep c and hiv. Per patient he wants to be tested for everything and he has cpx labs scheduled for 08/23/12. Please assist.

## 2012-06-10 NOTE — Telephone Encounter (Signed)
I don't know what he is referring to as "everything".  We usually test for HIV, RPR, and urine for GC/Chlamydia. We can also test for Hep C antibodies, though Hep C is usually NOT transmitted sexually.  Hep B is.  Has he had prior Hep B vaccine? We can go ahead and set up tests above.  If no hx of Hep B vaccine, I would also add testing for Hep B

## 2012-06-11 NOTE — Telephone Encounter (Signed)
LMTCB to clarify his request.

## 2012-06-12 NOTE — Telephone Encounter (Signed)
Pt would like you to call him at [phone]. Thanks.

## 2012-06-12 NOTE — Telephone Encounter (Signed)
I did call pt back, had to leave a message, so I asked that he call back to let us know if he every had the Hep series vaccine.

## 2012-06-12 NOTE — Telephone Encounter (Signed)
Pt called back, he would like to have the Hep C as his father has it.  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  I will order labs a requested.

## 2012-08-16 ENCOUNTER — Emergency Department (INDEPENDENT_AMBULATORY_CARE_PROVIDER_SITE_OTHER): Payer: Self-pay

## 2012-08-16 ENCOUNTER — Encounter (HOSPITAL_COMMUNITY): Payer: Self-pay | Admitting: Emergency Medicine

## 2012-08-16 ENCOUNTER — Emergency Department (INDEPENDENT_AMBULATORY_CARE_PROVIDER_SITE_OTHER)
Admission: EM | Admit: 2012-08-16 | Discharge: 2012-08-16 | Disposition: A | Discharge status: Home / Self Care | Payer: Self-pay | Source: Home / Self Care

## 2012-08-16 DIAGNOSIS — M542 Cervicalgia: Secondary | ICD-10-CM

## 2012-08-16 MED ORDER — MELOXICAM 15 MG PO TABS: 15.0000 mg | ORAL_TABLET | Freq: Every day | ORAL | Status: DC | PRN
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
## 2012-08-23 ENCOUNTER — Other Ambulatory Visit: Payer: 59

## 2012-08-30 ENCOUNTER — Encounter: Payer: 59 | Admitting: Family Medicine

## 2012-09-03 ENCOUNTER — Other Ambulatory Visit: Payer: Self-pay | Admitting: Sports Medicine

## 2012-09-03 DIAGNOSIS — M5410 Radiculopathy, site unspecified: Secondary | ICD-10-CM

## 2012-09-05 ENCOUNTER — Ambulatory Visit
Admission: RE | Admit: 2012-09-05 | Discharge: 2012-09-05 | Disposition: A | Discharge status: Home / Self Care | Payer: 59 | Source: Ambulatory Visit | Attending: Sports Medicine | Admitting: Sports Medicine

## 2012-09-05 DIAGNOSIS — M5410 Radiculopathy, site unspecified: Secondary | ICD-10-CM

## 2013-04-09 ENCOUNTER — Ambulatory Visit (INDEPENDENT_AMBULATORY_CARE_PROVIDER_SITE_OTHER): Payer: 59 | Admitting: Family

## 2013-04-09 ENCOUNTER — Encounter: Payer: Self-pay | Admitting: Gastroenterology

## 2013-04-09 ENCOUNTER — Encounter: Payer: Self-pay | Admitting: Family

## 2013-04-09 VITALS — BP 130/98 | HR 62 | Wt 378.8 lb

## 2013-04-09 DIAGNOSIS — K625 Hemorrhage of anus and rectum: Secondary | ICD-10-CM

## 2013-04-09 LAB — CBC WITH DIFFERENTIAL/PLATELET
BASOS PCT: 0.5 % (ref 0.0–3.0)
Basophils Absolute: 0 10*3/uL (ref 0.0–0.1)
EOS PCT: 2.3 % (ref 0.0–5.0)
Eosinophils Absolute: 0.1 10*3/uL (ref 0.0–0.7)
HCT: 44 % (ref 39.0–52.0)
Hemoglobin: 14.6 g/dL (ref 13.0–17.0)
LYMPHS PCT: 32.9 % (ref 12.0–46.0)
Lymphs Abs: 1.7 10*3/uL (ref 0.7–4.0)
MCHC: 33.1 g/dL (ref 30.0–36.0)
MCV: 88.3 fl (ref 78.0–100.0)
Monocytes Absolute: 0.4 10*3/uL (ref 0.1–1.0)
Monocytes Relative: 7.2 % (ref 3.0–12.0)
NEUTROS PCT: 57.1 % (ref 43.0–77.0)
Neutro Abs: 3 10*3/uL (ref 1.4–7.7)
Platelets: 270 10*3/uL (ref 150.0–400.0)
RBC: 4.99 Mil/uL (ref 4.22–5.81)
RDW: 12 % (ref 11.5–14.6)
WBC: 5.3 10*3/uL (ref 4.5–10.5)

## 2013-04-09 NOTE — Progress Notes (Signed)
Subjective:    Patient ID: Peter Sanders, male    DOB: 11/08/1990, 23 y.o.   MRN: [id number]  HPI 23 year old AA male, nonsmoker presenting today for rectal bleeding.  Rectal bleeding present x 3 months.  Bleeding typically occurs after a bowel movement.  Denies any history of hemorrhoids.  Grandfather has prostate cancer and grandmother has breast cancer. No new changes in eating pattern or sudden weight loss. Not taking NSAIDS.      Review of Systems  Constitutional: Negative.  Negative for activity change and fatigue.  Respiratory: Negative.   Cardiovascular: Negative.   Gastrointestinal: Positive for blood in stool. Negative for constipation.  Endocrine: Negative.   Genitourinary: Negative.   Musculoskeletal: Negative.   Skin: Negative.   Neurological: Negative.  Negative for dizziness, weakness and light-headedness.  Hematological: Negative.   Psychiatric/Behavioral: Negative for confusion.   Past Medical History  Diagnosis Date  . Morbid obesity   . GERD (gastroesophageal reflux disease)     OTC as needed  . Chronic tonsillitis 06/2011  . Allergic drug rash 07/06/2011    after taking Percocet  . Sleep apnea     snores, has apnea and wakes up gasping; states due to tonsil problem, has not had sleep study    History   Social History  . Marital Status: Single    Spouse Name: N/A    Number of Children: N/A  . Years of Education: N/A   Occupational History  . Not on file.   Social History Main Topics  . Smoking status: Never Smoker   . Smokeless tobacco: Never Used  . Alcohol Use: Yes     Comment: socially  . Drug Use: No  . Sexual Activity:    Other Topics Concern  . Not on file   Social History Narrative  . No narrative on file    Past Surgical History  Procedure Laterality Date  . Hernia repair      as an infant  . Tonsillectomy  07/18/2011    Procedure: TONSILLECTOMY;  Surgeon: Christia Readingwight Bates, MD;  Location: [facility];  Service:  ENT;  Laterality: Bilateral;    Family History  Problem Relation Age of Onset  . Diabetes Mother     type ll  . Drug abuse Father   . Hypertension Father   . Drug abuse Brother   . Cancer Maternal Grandmother     breast  . Kidney disease Maternal Grandmother   . Alcohol abuse Maternal Grandfather   . Cancer Maternal Grandfather     prostate  . Hypertension Paternal Grandfather   . Diabetes Paternal Grandfather     Allergies  Allergen Reactions  . Hydrocodone Hives    BURNING OF SKIN  . Penicillins Hives  . Percocet [Oxycodone-Acetaminophen] Hives    BURNING OF SKIN    Current Outpatient Prescriptions on File Prior to Visit  Medication Sig Dispense Refill  . cetirizine (ZYRTEC) 10 MG tablet Take 10 mg by mouth daily.      . diphenhydrAMINE (BENADRYL) 25 MG tablet Take 25 mg by mouth every 6 (six) hours as needed.      . famotidine (PEPCID) 20 MG tablet Take 20 mg by mouth daily.      . meloxicam (MOBIC) 15 MG tablet Take 1 tablet (15 mg total) by mouth daily as needed for pain.  14 tablet  0  . podofilox (CONDYLOX) 0.5 % external solution Apply BID to warts on penis for 3 days, stop  for 4 days, repeat cycle for 4 weeks.  3.5 mL  4  . vitamin E 200 UNIT capsule Take 200 Units by mouth daily.       No current facility-administered medications on file prior to visit.    BP 130/98  Pulse 62  Wt 378 lb 12.8 oz (171.823 kg)chart     Objective:   Physical Exam  Constitutional: He is oriented to person, place, and time. He appears well-developed and well-nourished.  Neck: Neck supple.  Cardiovascular: Normal rate, regular rhythm and normal heart sounds.   Pulmonary/Chest: Effort normal and breath sounds normal.  Abdominal: Soft. Bowel sounds are normal.  Genitourinary: Guaiac positive stool.  Musculoskeletal: Normal range of motion.  Neurological: He is alert and oriented to person, place, and time.  Skin: Skin is warm and dry.  Psychiatric: He has a normal mood and  affect.          Assessment & Plan:  Assessment 1. Rectal bleeding  Plan 1. Obtain lab that includes CBC.  2. Office will contact patient with results from lab.  3 Referral to GI.  4. Contact office for questions or concerns.

## 2013-04-09 NOTE — Patient Instructions (Signed)
Rectal Bleeding Rectal bleeding is when blood passes out of the anus. It is usually a sign that something is wrong. It may not be serious, but it should always be evaluated. Rectal bleeding may present as bright red blood or extremely dark stools. The color may range from dark red or maroon to black (like tar). It is important that the cause of rectal bleeding be identified so treatment can be started and the problem corrected. CAUSES   Hemorrhoids. These are enlarged (dilated) blood vessels or veins in the anal or rectal area.  Fistulas. Theseare abnormal, burrowing channels that usually run from inside the rectum to the skin around the anus. They can bleed.  Anal fissures. This is a tear in the tissue of the anus. Bleeding occurs with bowel movements.  Diverticulosis. This is a condition in which pockets or sacs project from the bowel wall. Occasionally, the sacs can bleed.  Diverticulitis. Thisis an infection involving diverticulosis of the colon.  Proctitis and colitis. These are conditions in which the rectum, colon, or both, can become inflamed and pitted (ulcerated).  Polyps and cancer. Polyps are non-cancerous (benign) growths in the colon that may bleed. Certain types of polyps turn into cancer.  Protrusion of the rectum. Part of the rectum can project from the anus and bleed.  Certain medicines.  Intestinal infections.  Blood vessel abnormalities. HOME CARE INSTRUCTIONS  Eat a high-fiber diet to keep your stool soft.  Limit activity.  Drink enough fluids to keep your urine clear or pale yellow.  Warm baths may be useful to soothe rectal pain.  Follow up with your caregiver as directed. SEEK IMMEDIATE MEDICAL CARE IF:  You develop increased bleeding.  You have black or dark red stools.  You vomit blood or material that looks like coffee grounds.  You have abdominal pain or tenderness.  You have a fever.  You feel weak, nauseous, or you faint.  You have  severe rectal pain or you are unable to have a bowel movement. MAKE SURE YOU:  Understand these instructions.  Will watch your condition.  Will get help right away if you are not doing well or get worse. Document Released: 07/02/2001 Document Revised: 04/04/2011 Document Reviewed: 06/27/2010 Exit[employer] Patient Information 2014 [employer].  

## 2013-04-09 NOTE — Progress Notes (Signed)
Pre visit review using our clinic review tool, if applicable. No additional management support is needed unless otherwise documented below in the visit note. 

## 2013-05-23 ENCOUNTER — Ambulatory Visit: Payer: 59 | Admitting: Gastroenterology

## 2013-07-19 ENCOUNTER — Telehealth: Payer: Self-pay | Admitting: Gastroenterology

## 2013-07-19 ENCOUNTER — Ambulatory Visit: Payer: 59 | Admitting: Gastroenterology

## 2013-07-19 NOTE — Telephone Encounter (Signed)
No charge. 

## 2014-01-03 ENCOUNTER — Telehealth: Payer: Self-pay | Admitting: Family Medicine

## 2014-01-03 NOTE — Telephone Encounter (Signed)
Pt was prescribed etoconazole 2 tablets x1 dose (anti fungal) which has returned. Pt would like a refill  [facility] outpt pharm.  Pt made a cpe for next year,

## 2014-01-05 NOTE — Telephone Encounter (Signed)
This should be Ketoconazole 200 mg- two tablets po times one dose-OK to refill.

## 2014-01-06 MED ORDER — KETOCONAZOLE 200 MG PO TABS: ORAL_TABLET | ORAL | Status: DC

## 2014-01-06 NOTE — Telephone Encounter (Signed)
RX sent to pharmacy  

## 2014-04-11 ENCOUNTER — Other Ambulatory Visit: Payer: 59

## 2014-04-18 ENCOUNTER — Encounter: Payer: 59 | Admitting: Family Medicine
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
## 2015-04-20 DIAGNOSIS — Z3141 Encounter for fertility testing: Secondary | ICD-10-CM | POA: Diagnosis not present

## 2016-04-22 MED FILL — DUREZOL 0.05% EYE DROPS: 0.05 | 16 days supply | Qty: 5 | Fill #0

## 2016-04-22 MED FILL — ZYMAXID 0.5% EYE DROPS: 0.5 | 8 days supply | Qty: 3 | Fill #0

## 2016-11-02 ENCOUNTER — Ambulatory Visit (INDEPENDENT_AMBULATORY_CARE_PROVIDER_SITE_OTHER): Payer: 59 | Admitting: Family Medicine

## 2016-11-02 ENCOUNTER — Encounter (HOSPITAL_COMMUNITY): Payer: Self-pay | Admitting: Family Medicine

## 2016-11-02 ENCOUNTER — Ambulatory Visit (HOSPITAL_COMMUNITY)
Admission: EM | Admit: 2016-11-02 | Discharge: 2016-11-02 | Disposition: A | Discharge status: Home / Self Care | Payer: 59 | Attending: Family Medicine | Admitting: Family Medicine

## 2016-11-02 ENCOUNTER — Encounter: Payer: Self-pay | Admitting: Family Medicine

## 2016-11-02 VITALS — BP 150/90 | HR 69 | Temp 98.8°F | Resp 16 | Ht 69.0 in | Wt 379.0 lb

## 2016-11-02 DIAGNOSIS — K921 Melena: Secondary | ICD-10-CM

## 2016-11-02 DIAGNOSIS — K5900 Constipation, unspecified: Secondary | ICD-10-CM

## 2016-11-02 DIAGNOSIS — K625 Hemorrhage of anus and rectum: Secondary | ICD-10-CM

## 2016-11-02 NOTE — ED Triage Notes (Signed)
Pt here for some constipation issues and rectal bleeding. Reports when he wipes and some in the stool. Reports rectal soreness.

## 2016-11-02 NOTE — Discharge Instructions (Signed)
You may start taking an over the counter stool softener such as colace.

## 2016-11-02 NOTE — Progress Notes (Signed)
Subjective:     Patient ID: Peter Sanders, male   DOB: [birth year], 26 y.o.   MRN: [id number]  HPI   Patient is seen today as a work-in with recent onset of hematochezia. 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 He recalls this past Saturday having to strain with bowel movement and afterwards noticed some bright red blood per rectum. He's had some blood with wiping and also mixed with stool since then. He went to urgent care earlier today and apparently no exam was done and he was told to follow-up with primary care.  Patient states couple years ago had brief similar episode that eventually cleared. He noted a small amount of soreness around the anal area but no pain with bowel movements. No recent appetite change. He states he has recently lost some weight due to his efforts. No abdominal pain. He denies any family history of colon cancer or other colon problems  Past Medical History:  Diagnosis Date  . Allergic drug rash 07/06/2011   after taking Percocet  . Chronic tonsillitis 06/2011  . GERD (gastroesophageal reflux disease)    OTC as needed  . Morbid obesity (HCC)   . Sleep apnea    snores, has apnea and wakes up gasping; states due to tonsil problem, has not had sleep study   Past Surgical History:  Procedure Laterality Date  . HERNIA REPAIR     as an infant  . TONSILLECTOMY  07/18/2011   Procedure: TONSILLECTOMY;  Surgeon: Christia Reading, MD;  Location: [facility];  Service: ENT;  Laterality: Bilateral;    reports that he has never smoked. He has never used smokeless tobacco. He reports that he drinks alcohol. He reports that he does not use drugs. family history includes Alcohol abuse in his maternal grandfather; Cancer in his maternal grandfather and maternal grandmother; Diabetes in his mother and paternal grandfather; Drug abuse in his brother and father; Hypertension in his father and paternal grandfather; Kidney disease in his  maternal grandmother. Allergies  Allergen Reactions  . Hydrocodone Hives    BURNING OF SKIN  . Penicillins Hives  . Percocet [Oxycodone-Acetaminophen] Hives    BURNING OF SKIN  . Sulfa Antibiotics Hives     Review of Systems  Constitutional: Negative for appetite change, chills and fever.  Respiratory: Negative for shortness of breath.   Cardiovascular: Negative for chest pain.  Gastrointestinal: Positive for blood in stool and constipation. Negative for abdominal pain, diarrhea, nausea and vomiting.  Neurological: Negative for dizziness and light-headedness.       Objective:   Physical Exam  Constitutional: He appears well-developed and well-nourished.  Cardiovascular: Normal rate and regular rhythm.   Pulmonary/Chest: Effort normal and breath sounds normal. No respiratory distress. He has no wheezes. He has no rales.  Abdominal: Soft. There is no tenderness.  Genitourinary:  Genitourinary Comments: No external hemorrhoids. No visible anal fissures. Digital exam no mass. Anoscopy performed. We cannot appreciate any visible source of bleeding. Does not appear to have significantly inflamed internal hemorrhoids       Assessment:     Patient presents with a few day history of intermittent hematochezia. Given his age, benign etiologies would be much more likely but we cannot determine definitive source of bleeding on exam today such as fissure or internal hemorrhoid    Plan:     -Check CBC -Discussed measures to reduce constipation and straining -Set up GI referral to further assess  Rolland Steinert  Dan Europe MD [facility]

## 2016-11-02 NOTE — Patient Instructions (Signed)
We will call you with labs Will be setting up GI referral as we did not see definitive source of blood on exam today Drink plenty of fluids and get 25-30 grams fiber per day Consider stool softener such as colace.

## 2016-11-02 NOTE — ED Provider Notes (Signed)
[facility]   [id number] 11/02/16 Arrival Time: 1212  ASSESSMENT & PLAN:  1. Rectal bleeding    He plans to schedule f/u with his PCP to discuss seeing a gastroenterologist. Start stool softener. May f/u here as needed. Reviewed expectations re: course of current medical issues. Questions answered. Outlined signs and symptoms indicating need for more acute intervention. Patient verbalized understanding. After Visit Summary given.   SUBJECTIVE:  Beckett P Lindamood is a 26 y.o. male who presents with complaint of rectal bleeding. Noticed this morning after hard bowel movement with straining. Bright red blood on toilet tissue. Mild sharp discomfort with BM. H/O similar 3 years ago but resolved quickly. 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 Normal appetite. No n/v. Weight stable. No new medications. No OTC treatment.  Past Surgical History:  Procedure Laterality Date  . HERNIA REPAIR     as an infant  . TONSILLECTOMY  07/18/2011   Procedure: TONSILLECTOMY;  Surgeon: Christia Reading, MD;  Location: [facility];  Service: ENT;  Laterality: Bilateral;   ROS: As per HPI.  OBJECTIVE:  Vitals:   11/02/16 1229  BP: 133/85  Pulse: 70  Resp: 18  Temp: 98.1 F (36.7 C)  SpO2: 95%    General appearance: alert; no distress Lungs: clear to auscultation bilaterally Heart: regular rate and rhythm Abdomen: obese; soft; non-distended; no tenderness; bowel sounds present; no masses or organomegaly; no guarding or rebound tenderness Rectal: he declines exam Skin: warm and dry Psychological: alert and cooperative; normal mood and affect  Allergies  Allergen Reactions  . Hydrocodone Hives    BURNING OF SKIN  . Penicillins Hives  . Percocet [Oxycodone-Acetaminophen] Hives    BURNING OF SKIN                                               Past Medical History:  Diagnosis Date  . Allergic drug rash 07/06/2011   after taking Percocet  .  Chronic tonsillitis 06/2011  . GERD (gastroesophageal reflux disease)    OTC as needed  . Morbid obesity (HCC)   . Sleep apnea    snores, has apnea and wakes up gasping; states due to tonsil problem, has not had sleep study   Social History   Social History  . Marital status: Single    Spouse name: N/A  . Number of children: N/A  . Years of education: N/A   Occupational History  . Not on file.   Social History Main Topics  . Smoking status: Never Smoker  . Smokeless tobacco: Never Used  . Alcohol use Yes     Comment: socially  . Drug use: No  . Sexual activity: Not on file   Other Topics Concern  . Not on file   Social History Narrative  . No narrative on file   Family History  Problem Relation Age of Onset  . Diabetes Mother        type ll  . Drug abuse Father   . Hypertension Father   . Drug abuse Brother   . Cancer Maternal Grandmother        breast  . Kidney disease Maternal Grandmother   . Alcohol abuse Maternal Grandfather   . Cancer Maternal Grandfather        prostate  . Hypertension Paternal Grandfather   .  Diabetes Paternal Glynda Jaeger, MD 11/02/16 1254

## 2016-11-03 ENCOUNTER — Encounter: Payer: Self-pay | Admitting: Internal Medicine

## 2016-11-03 LAB — CBC WITH DIFFERENTIAL/PLATELET
Basophils Absolute: 0 10*3/uL (ref 0.0–0.1)
Basophils Relative: 0.5 % (ref 0.0–3.0)
EOS ABS: 0.2 10*3/uL (ref 0.0–0.7)
Eosinophils Relative: 3.6 % (ref 0.0–5.0)
HEMATOCRIT: 42 % (ref 39.0–52.0)
Hemoglobin: 13.8 g/dL (ref 13.0–17.0)
LYMPHS PCT: 36.5 % (ref 12.0–46.0)
Lymphs Abs: 2.1 10*3/uL (ref 0.7–4.0)
MCHC: 32.8 g/dL (ref 30.0–36.0)
MCV: 89.4 fl (ref 78.0–100.0)
MONO ABS: 0.6 10*3/uL (ref 0.1–1.0)
Monocytes Relative: 11.1 % (ref 3.0–12.0)
NEUTROS ABS: 2.8 10*3/uL (ref 1.4–7.7)
Neutrophils Relative %: 48.3 % (ref 43.0–77.0)
Platelets: 285 10*3/uL (ref 150.0–400.0)
RBC: 4.7 Mil/uL (ref 4.22–5.81)
RDW: 12.6 % (ref 11.5–15.5)
WBC: 5.8 10*3/uL (ref 4.0–10.5)

## 2016-12-28 ENCOUNTER — Ambulatory Visit: Payer: 59 | Admitting: Internal Medicine

## 2017-07-08 ENCOUNTER — Emergency Department (HOSPITAL_COMMUNITY)
Admission: EM | Admit: 2017-07-08 | Discharge: 2017-07-08 | Disposition: A | Discharge status: Home / Self Care | Payer: 59 | Attending: Emergency Medicine | Admitting: Emergency Medicine

## 2017-07-08 ENCOUNTER — Encounter (HOSPITAL_COMMUNITY): Payer: Self-pay | Admitting: Emergency Medicine

## 2017-07-08 ENCOUNTER — Emergency Department (HOSPITAL_COMMUNITY): Payer: 59

## 2017-07-08 DIAGNOSIS — M5441 Lumbago with sciatica, right side: Secondary | ICD-10-CM | POA: Insufficient documentation

## 2017-07-08 DIAGNOSIS — M545 Low back pain: Secondary | ICD-10-CM | POA: Diagnosis present

## 2017-07-08 MED ORDER — METHOCARBAMOL 500 MG PO TABS: 500.0000 mg | ORAL_TABLET | Freq: Three times a day (TID) | ORAL | 0 refills | Status: AC

## 2017-07-08 MED ORDER — IBUPROFEN 400 MG PO TABS
600.0000 mg | ORAL_TABLET | Freq: Once | ORAL | Status: AC
  Administered 2017-07-08: 600 mg via ORAL
  Filled 2017-07-08: qty 1

## 2017-07-08 MED ORDER — HYDROMORPHONE HCL 2 MG/ML IJ SOLN
1.0000 mg | Freq: Once | INTRAMUSCULAR | Status: AC
  Administered 2017-07-08: 1 mg via INTRAMUSCULAR
  Filled 2017-07-08: qty 1

## 2017-07-08 MED ORDER — PREDNISONE 10 MG PO TABS: 40.0000 mg | ORAL_TABLET | Freq: Every day | ORAL | 0 refills | Status: AC

## 2017-07-08 MED ORDER — TRAMADOL HCL 50 MG PO TABS: 50.0000 mg | ORAL_TABLET | Freq: Four times a day (QID) | ORAL | 0 refills | Status: AC | PRN

## 2017-07-08 MED ORDER — ACETAMINOPHEN 500 MG PO TABS
1000.0000 mg | ORAL_TABLET | Freq: Once | ORAL | Status: AC
Start: 2017-07-08 — End: 2017-07-08
  Administered 2017-07-08: 1000 mg via ORAL
  Filled 2017-07-08: qty 2

## 2017-07-08 NOTE — ED Provider Notes (Signed)
[facility] EMERGENCY DEPARTMENT Provider Note   CSN: [id number] Arrival date & time: 07/08/17  1515     History   Chief Complaint Chief Complaint  Patient presents with  . Back Pain    HPI Peter Sanders is a 27 y.o. male here for evaluation of back pain.  Pain is described as severe.  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  Pain initially mild however over the last 3 to 4 days it has gotten much worse.  He has tried a mix of ibuprofen, Tylenol and other back relief over-the-counter pain medications with no relief.  Pain is to the diffuse low back, worse midline.  Pain intermittently radiates down to the right buttock.  Aggravated by movement, sitting up, leaning forward, walking.  No alleviating factors he denies previous back pain, injury or surgeries.  He denies any fevers, chills, abdominal pain, nausea, vomiting, dysuria, hematuria, changes to bowel movements, saddle anesthesia, bladder or bowel incontinence or retention, numbness loss of sensation or weakness to his extremities.  No IV drug use.  No history of kidney stones.    HPI  Past Medical History:  Diagnosis Date  . Allergic drug rash 07/06/2011   after taking Percocet  . Chronic tonsillitis 06/2011  . GERD (gastroesophageal reflux disease)    OTC as needed  . Morbid obesity (HCC)   . Sleep apnea    snores, has apnea and wakes up gasping; states due to tonsil problem, has not had sleep study    Patient Active Problem List   Diagnosis Date Noted  . Tinea versicolor 03/23/2011  . CONSTIPATION 01/23/2007  . RECTAL BLEEDING 01/23/2007  . OBESITY 09/04/2006  . ALLERGIC RHINITIS 09/04/2006    Past Surgical History:  Procedure Laterality Date  . HERNIA REPAIR     as an infant  . TONSILLECTOMY  07/18/2011   Procedure: TONSILLECTOMY;  Surgeon: Christia Reading, MD;  Location: [facility];  Service: ENT;  Laterality: Bilateral;        Home Medications    Prior  to Admission medications   Medication Sig Start Date End Date Taking? Authorizing Provider  acetaminophen (TYLENOL) 500 MG tablet Take 500 mg by mouth every 6 (six) hours as needed.    [provider]  predniSONE (DELTASONE) 10 MG tablet Take 4 tablets (40 mg total) by mouth daily for 5 days. 07/08/17 07/13/17  Liberty Handy, PA-C  traMADol (ULTRAM) 50 MG tablet Take 1 tablet (50 mg total) by mouth every 6 (six) hours as needed for up to 3 days. 07/08/17 07/11/17  Liberty Handy, PA-C    Family History Family History  Problem Relation Age of Onset  . Diabetes Mother        type ll  . Drug abuse Father   . Hypertension Father   . Drug abuse Brother   . Cancer Maternal Grandmother        breast  . Kidney disease Maternal Grandmother   . Alcohol abuse Maternal Grandfather   . Cancer Maternal Grandfather        prostate  . Hypertension Paternal Grandfather   . Diabetes Paternal Grandfather     Social History Social History   Tobacco Use  . Smoking status: Never Smoker  . Smokeless tobacco: Never Used  Substance Use Topics  . Alcohol use: Yes    Comment: socially  . Drug use: No     Allergies   Hydrocodone; Penicillins; Percocet [  oxycodone-acetaminophen]; and Sulfa antibiotics   Review of Systems Review of Systems  Musculoskeletal: Positive for back pain.  All other systems reviewed and are negative.    Physical Exam Updated Vital Signs BP 134/72 (BP Location: Right Arm)   Pulse 78   Temp 97.8 F (36.6 C) (Oral)   Resp 20   SpO2 97%   Physical Exam  Constitutional: He appears well-developed and well-nourished. No distress.  HENT:  Head: Normocephalic and atraumatic.  Nose: Nose normal.  Eyes: EOM are normal.  Neck: Normal range of motion.  Cardiovascular: Normal rate, S1 normal, S2 normal and normal heart sounds.  Pulses:      Radial pulses are 2+ on the right side, and 2+ on the left side.       Dorsalis pedis pulses are 2+ on the right  side, and 2+ on the left side.  Pulmonary/Chest: Effort normal and breath sounds normal. He has no decreased breath sounds. He exhibits no tenderness.  Abdominal: Soft. Normal appearance and bowel sounds are normal. There is no tenderness.  No suprapubic or CVA tenderness   Musculoskeletal: He exhibits tenderness.       Lumbar back: He exhibits tenderness and pain.  Exam is limited due to body habitus, difficult to palpate spinous process, SI joint, sciatic notch.  Mild midline and paraspinal muscular tenderness to lumbar spine. Reproducible low back pain with PROM of right hip. Positive RIGHT SLR and Faber.   Neurological:  5/5 strength with flexion/extension of hip, knee and ankle, bilaterally.  Sensation to light touch intact in lower extremities including feet  Skin: Skin is warm and dry. Capillary refill takes less than 2 seconds.  Psychiatric: He has a normal mood and affect. His behavior is normal. Judgment and thought content normal.     ED Treatments / Results  Labs (all labs ordered are listed, but only abnormal results are displayed) Labs Reviewed - No data to display  EKG None  Radiology No results found.  Procedures Procedures (including critical care time)  Medications Ordered in ED Medications  HYDROmorphone (DILAUDID) injection 1 mg (has no administration in time range)  ibuprofen (ADVIL,MOTRIN) tablet 600 mg (has no administration in time range)  acetaminophen (TYLENOL) tablet 1,000 mg (has no administration in time range)     Initial Impression / Assessment and Plan / ED Course  I have reviewed the triage vital signs and the nursing notes.  Pertinent labs & imaging results that were available during my care of the patient were reviewed by me and considered in my medical decision making (see chart for details).    27 yo M with morbid obesity here for back pain.  Exam is difficult due to body habitus, however mostly reassuring. MSK exam revealed mild lumbar  spine tenderness, positive R SLR and Faber. He has no suprapubic or CVA tenderness, urinary symptoms or h/o kidney stones making pyelonphritis/kidney stone unlikely No red flag symptoms such as bladder/bowel incontinence or retention, night sweats, night pain, fevers or weight loss, h/o cancer, IVDU, recent trauma or falls. No s/s of cauda equina or pidural abscess and these don't fit clinical picture. Most likely lumbar strain vs spasm. Given new onset acute, severe pain, body habitus and unreliable exam will obtain plain films to r/o bony pathology.  I do think pt is quite in some pain. He is afraid of taking narcotic pain meds as he is a truck driver and does not know if a positive urine test will get him in  trouble.  I recommended oral NSAIDs, ultimately he opted for dilaudid IM. Will also give oral NSAID to maximize pain control.   1711: Pt will be handed off to oncoming EDPA who will f/u on x-rays and discharge pt if benign results. Will recommend discharge with tramadol, ibuprofen/acetaminophen, robaxin and prednisone taper for suspected sciatica vs MSK strain/spasm. Plan discussed with pt. VS WNL at shift change. Will provide pt with work note for use of tramadol prn x 3 days.    Final Clinical Impressions(s) / ED Diagnoses   Final diagnoses:  Acute right-sided low back pain with right-sided sciatica    ED Discharge Orders        Ordered    traMADol (ULTRAM) 50 MG tablet  Every 6 hours PRN     07/08/17 1715    predniSONE (DELTASONE) 10 MG tablet  Daily     07/08/17 1715       Liberty HandyGibbons, September Mormile J, [place] JerseyPA-C 07/08/17 1716    Cathren LaineSteinl, Kevin, MD 07/10/17 [phone]

## 2017-07-08 NOTE — ED Triage Notes (Signed)
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 Patient states pain has gotten worse over time. PAtient alert, oriented, and ambulating independently with steady gait.

## 2017-07-08 NOTE — ED Notes (Signed)
See provider assessment 

## 2017-07-08 NOTE — ED Provider Notes (Signed)
Pt signed out to me by Janene Madeira Gibbons, PA-C. Please see previous notes for full history.   Pt presenting for evaluation of low back pain x 3 days. Midline/low back. Gradual, now worsening. Tried multiple OTC without relief. Initial exam difficulty due to pt's body habitus, but has apparent +SLR. likely MSK/sciatica, but pain not easily reproducible with palpation. Xrays pending to r/o other concerning cause for pain including tumor. Plan for d/c with prednisone and tramadol if xrays are normal. F/u with PCP as needed.   Xrays viewed and interpreted by me, shows no fx, dislocation, or concerns for tumor. Discussed with pt. With d/c with symptomatic tx. At this time, pt appears safe for d/c. Return precautions given. Pt states he understands and agrees to plan.       Alveria ApleyCaccavale, Yeng Perz, PA-C 07/08/17 1754    Linwood DibblesKnapp, Jon, MD 07/09/17 Jacinta [id number]

## 2017-07-08 NOTE — Discharge Instructions (Addendum)
Your x-rays were normal.  Given your symptoms and physical exam findings, I suspect you have muscular spasm and/or strain from recent overuse/recent activity.  This could also be leading to mild sciatic nerve inflammation.   We will treat your inflammation with the following medication regimen: Prednisone 40 mg daily x 5 days Robaxin 500 mg every 8 hours x 5 days Ibuprofen 600 mg + Acetaminophen 1000 mg every 8 hours x 5 days Tramadol 50 mg every 6 hours x 3 days  Heating pad as needed Over the counter lidocaine patches (salonpas) can be helpful  Avoid any exacerbating activities for the next 48 hours.  After 48 hours, start doing light back range of motion exercises and walking to avoid worsening back stiffness.

## 2017-07-08 NOTE — ED Notes (Signed)
Pt back from X-ray.  

## 2017-07-08 NOTE — ED Notes (Signed)
Patient transported to X-ray 

## 2018-08-16 ENCOUNTER — Other Ambulatory Visit: Payer: Self-pay

## 2018-08-16 ENCOUNTER — Ambulatory Visit (HOSPITAL_COMMUNITY)
Admission: EM | Admit: 2018-08-16 | Discharge: 2018-08-16 | Disposition: A | Discharge status: Home / Self Care | Payer: No Typology Code available for payment source | Attending: Internal Medicine | Admitting: Internal Medicine

## 2018-08-16 ENCOUNTER — Encounter (HOSPITAL_COMMUNITY): Payer: Self-pay | Admitting: Emergency Medicine

## 2018-08-16 DIAGNOSIS — Z23 Encounter for immunization: Secondary | ICD-10-CM

## 2018-08-16 DIAGNOSIS — S51811A Laceration without foreign body of right forearm, initial encounter: Secondary | ICD-10-CM

## 2018-08-16 DIAGNOSIS — W01110A Fall on same level from slipping, tripping and stumbling with subsequent striking against sharp glass, initial encounter: Secondary | ICD-10-CM | POA: Diagnosis not present

## 2018-08-16 MED ORDER — TETANUS-DIPHTH-ACELL PERTUSSIS 5-2.5-18.5 LF-MCG/0.5 IM SUSP
0.5000 mL | Freq: Once | INTRAMUSCULAR | Status: AC
  Administered 2018-08-16: 0.5 mL via INTRAMUSCULAR

## 2018-08-16 MED ORDER — TETANUS-DIPHTH-ACELL PERTUSSIS 5-2.5-18.5 LF-MCG/0.5 IM SUSP
INTRAMUSCULAR | Status: AC
  Filled 2018-08-16: qty 0.5

## 2018-08-16 NOTE — ED Provider Notes (Addendum)
[name]    CSN: [id number] Arrival date & time: 08/16/18  1126     History   Chief Complaint Chief Complaint  Patient presents with  . Extremity Laceration    HPI Peter Sanders is a 28 y.o. male with history of gastroesophageal reflux disease comes to urgent care for laceration on the right forearm.  Patient was working out when he tripped over his shoelace and fell.  He had a glass in his hand and the glass fell as well, broke off and cut him.  Patient denies any injury to the head.  He denies any other pain or discomfort at this time.   HPI  Past Medical History:  Diagnosis Date  . Allergic drug rash 07/06/2011   after taking Percocet  . Chronic tonsillitis 06/2011  . GERD (gastroesophageal reflux disease)    OTC as needed  . Morbid obesity ([place])   . Sleep apnea    snores, has apnea and wakes up gasping; states due to tonsil problem, has not had sleep study    Patient Active Problem List   Diagnosis Date Noted  . Tinea versicolor 03/23/2011  . CONSTIPATION 01/23/2007  . RECTAL BLEEDING 01/23/2007  . OBESITY 09/04/2006  . ALLERGIC RHINITIS 09/04/2006    Past Surgical History:  Procedure Laterality Date  . HERNIA REPAIR     as an infant  . TONSILLECTOMY  07/18/2011   Procedure: TONSILLECTOMY;  Surgeon: Melida Quitter, MD;  Location: [facility];  Service: ENT;  Laterality: Bilateral;       Home Medications    Prior to Admission medications   Medication Sig Start Date End Date Taking? Authorizing Provider  acetaminophen (TYLENOL) 500 MG tablet Take 500 mg by mouth every 6 (six) hours as needed.    [provider]    Family History Family History  Problem Relation Age of Onset  . Diabetes Mother        type ll  . Drug abuse Father   . Hypertension Father   . Drug abuse Brother   . Cancer Maternal Grandmother        breast  . Kidney disease Maternal Grandmother   . Alcohol abuse Maternal Grandfather   .  Cancer Maternal Grandfather        prostate  . Hypertension Paternal Grandfather   . Diabetes Paternal Grandfather     Social History Social History   Tobacco Use  . Smoking status: Never Smoker  . Smokeless tobacco: Never Used  Substance Use Topics  . Alcohol use: Yes    Comment: socially  . Drug use: No     Allergies   Hydrocodone, Penicillins, Percocet [oxycodone-acetaminophen], and Sulfa antibiotics   Review of Systems Review of Systems  Constitutional: Negative.   Respiratory: Negative.   Gastrointestinal: Negative.   Genitourinary: Negative.   Musculoskeletal: Negative.   Skin: Positive for wound. Negative for color change, pallor and rash.     Physical Exam Triage Vital Signs ED Triage Vitals  Enc Vitals Group     BP 08/16/18 1203 (!) 178/103     Pulse Rate 08/16/18 1201 62     Resp 08/16/18 1201 16     Temp 08/16/18 1201 98.4 F (36.9 C)     Temp Source 08/16/18 1201 Oral     SpO2 08/16/18 1201 99 %     Weight --      Height --      Head Circumference --  Peak Flow --      Pain Score 08/16/18 1200 9     Pain Loc --      Pain Edu? --      Excl. in GC? --    No data found.  Updated Vital Signs BP (!) 178/103   Pulse 62   Temp 98.4 F (36.9 C) (Oral)   Resp 16   SpO2 99%   Visual Acuity Right Eye Distance:   Left Eye Distance:   Bilateral Distance:    Right Eye Near:   Left Eye Near:    Bilateral Near:     Physical Exam Constitutional:      General: He is not in acute distress.    Appearance: He is obese. He is not ill-appearing or toxic-appearing.  HENT:     Mouth/Throat:     Mouth: Mucous membranes are dry.     Pharynx: Oropharynx is clear.  Pulmonary:     Effort: Pulmonary effort is normal. No respiratory distress.     Breath sounds: Normal breath sounds. No wheezing or rales.  Skin:    Capillary Refill: Capillary refill takes less than 2 seconds.     Comments: Laceration over right forearm.  Laceration is about 2  inches long and curvilinear.  Neurological:     Mental Status: He is alert.      UC Treatments / Results  Labs (all labs ordered are listed, but only abnormal results are displayed) Labs Reviewed - No data to display  EKG   Radiology No results found.  Procedures Laceration Repair  Date/Time: 08/16/2018 1:11 PM Performed by: Merrilee JanskyLamptey, Suraj Ramdass O, MD Authorized by: Merrilee JanskyLamptey, Aseret Hoffman O, MD   Consent:    Consent obtained:  Verbal   Consent given by:  Patient   Risks discussed:  Infection, poor cosmetic result and poor wound healing   Alternatives discussed:  No treatment and observation Anesthesia (see MAR for exact dosages):    Anesthesia method:  Local infiltration   Local anesthetic:  Lidocaine 2% w/o epi Laceration details:    Location:  Shoulder/arm   Shoulder/arm location:  R lower arm   Length (cm):  5   Depth (mm):  5 Repair type:    Repair type:  Simple Pre-procedure details:    Preparation:  Patient was prepped and draped in usual sterile fashion Exploration:    Hemostasis achieved with:  Direct pressure   Wound exploration: wound explored through full range of motion     Wound extent: areolar tissue violated     Contaminated: no   Treatment:    Area cleansed with:  Saline   Amount of cleaning:  Standard Skin repair:    Repair method:  Sutures   Suture size:  4-0   Suture material:  Nylon   Suture technique:  Simple interrupted Approximation:    Approximation:  Close Post-procedure details:    Dressing:  Antibiotic ointment and adhesive bandage   Patient tolerance of procedure:  Tolerated well, no immediate complications   (including critical care time)  Medications Ordered in UC Medications  Tdap (BOOSTRIX) injection 0.5 mL (0.5 mLs Intramuscular Given 08/16/18 1302)  Tdap (BOOSTRIX) 5-2.5-18.5 LF-MCG/0.5 injection (has no administration in time range)    Initial Impression / Assessment and Plan / UC Course  I have reviewed the triage vital signs  and the nursing notes.  Pertinent labs & imaging results that were available during my care of the patient were reviewed by me and considered in my medical  decision making (see chart for details).     1.  Laceration over right forearm: Laceration repaired Discharge instructions regarding sutured wound care was given. If patient notices redness, discharge, fever or chills he is advised to return to urgent care to be reevaluated.  Final Clinical Impressions(s) / UC Diagnoses   Final diagnoses:  Laceration of right forearm, initial encounter   Discharge Instructions   None    ED Prescriptions    None     Controlled Substance Prescriptions [place] Controlled Substance Registry consulted? No   Merrilee JanskyLamptey, Oluwaseun Cremer O, MD 08/16/18 1311    Merrilee JanskyLamptey, Mayari Matus O, MD 08/16/18 [phone]

## 2018-08-16 NOTE — ED Triage Notes (Signed)
PT cut right forearm on glass today. Bleeding controlled with dressing.

## 2018-10-10 ENCOUNTER — Ambulatory Visit (HOSPITAL_COMMUNITY)
Admission: EM | Admit: 2018-10-10 | Discharge: 2018-10-10 | Disposition: A | Discharge status: Home / Self Care | Payer: Self-pay | Attending: Family Medicine | Admitting: Family Medicine

## 2018-10-10 ENCOUNTER — Other Ambulatory Visit: Payer: Self-pay

## 2018-10-10 ENCOUNTER — Encounter (HOSPITAL_COMMUNITY): Payer: Self-pay | Admitting: Family Medicine

## 2018-10-10 DIAGNOSIS — L304 Erythema intertrigo: Secondary | ICD-10-CM

## 2018-10-10 MED ORDER — FLUCONAZOLE 100 MG PO TABS: ORAL_TABLET | ORAL | 0 refills | Status: DC

## 2018-10-10 MED FILL — FLUCONAZOLE 100 MG TABLET: 100 | 10 days supply | Qty: 11 | Fill #0

## 2018-10-10 NOTE — ED Triage Notes (Signed)
Pt presents with a rash on neck and on chest  X 1 week

## 2018-10-10 NOTE — ED Provider Notes (Signed)
[place]   [id number] 10/10/18 Arrival Time: 9417  ASSESSMENT & PLAN:  1. Intertrigo     Meds ordered this encounter  Medications  . fluconazole (DIFLUCAN) 100 MG tablet    Sig: Take 2 tablets on day #1 then one tablet daily for an additional 9 days.    Dispense:  11 tablet    Refill:  0    Will follow up with PCP or here if worsening or failing to improve as anticipated. Reviewed expectations re: course of current medical issues. Questions answered. Outlined signs and symptoms indicating need for more acute intervention. Patient verbalized understanding. After Visit Summary given.   SUBJECTIVE:  Peter Sanders is a 28 y.o. male who presents with a skin complaint.   Location: skin folds of abdomen and around neck Onset: gradual Duration: 1 week Associated pruritis? minimal Associated pain? none Progression: stable  Drainage? none Known trigger? No  New soaps/lotions/topicals/detergents/environmental exposures? No Contacts with similar? No Recent travel? No  Other associated symptoms: none Therapies tried thus far: none Arthralgia or myalgia? none Recent illness? none Fever? none New medications? none No specific aggravating or alleviating factors reported.  ROS: As per HPI.  OBJECTIVE: Vitals:   10/10/18 1345  BP: 129/77  Pulse: 70  Resp: 18  Temp: 98.2 F (36.8 C)  TempSrc: Oral  SpO2: 96%    General appearance: alert; no distress; obese Lungs: clear to auscultation bilaterally Heart: regular rate and rhythm Extremities: no edema Skin: warm and dry; signs of infection: no; moist, red or red-brown, beefy, homogenous patches located in skin folds of abdomen and of back; few satellite lesions Psychological: alert and cooperative; normal mood and affect  Allergies  Allergen Reactions  . Hydrocodone Hives    BURNING OF SKIN  . Penicillins Hives  . Percocet [Oxycodone-Acetaminophen] Hives    BURNING OF SKIN  . Sulfa  Antibiotics Hives    Past Medical History:  Diagnosis Date  . Allergic drug rash 07/06/2011   after taking Percocet  . Chronic tonsillitis 06/2011  . GERD (gastroesophageal reflux disease)    OTC as needed  . Morbid obesity ([place])   . Sleep apnea    snores, has apnea and wakes up gasping; states due to tonsil problem, has not had sleep study   Social History   Socioeconomic History  . Marital status: Married    Spouse name: Not on file  . Number of children: Not on file  . Years of education: Not on file  . Highest education level: Not on file  Occupational History  . Not on file  Social Needs  . Financial resource strain: Not on file  . Food insecurity    Worry: Not on file    Inability: Not on file  . Transportation needs    Medical: Not on file    Non-medical: Not on file  Tobacco Use  . Smoking status: Never Smoker  . Smokeless tobacco: Never Used  Substance and Sexual Activity  . Alcohol use: Yes    Comment: socially  . Drug use: No  . Sexual activity: Not on file  Lifestyle  . Physical activity    Days per week: Not on file    Minutes per session: Not on file  . Stress: Not on file  Relationships  . Social Herbalist on phone: Not on file    Gets together: Not on file    Attends religious service: Not on file    Active  member of club or organization: Not on file    Attends meetings of clubs or organizations: Not on file    Relationship status: Not on file  . Intimate partner violence    Fear of current or ex partner: Not on file    Emotionally abused: Not on file    Physically abused: Not on file    Forced sexual activity: Not on file  Other Topics Concern  . Not on file  Social History Narrative  . Not on file   Family History  Problem Relation Age of Onset  . Diabetes Mother        type ll  . Drug abuse Father   . Hypertension Father   . Drug abuse Brother   . Cancer Maternal Grandmother        breast  . Kidney disease Maternal  Grandmother   . Alcohol abuse Maternal Grandfather   . Cancer Maternal Grandfather        prostate  . Hypertension Paternal Grandfather   . Diabetes Paternal Grandfather    Past Surgical History:  Procedure Laterality Date  . HERNIA REPAIR     as an infant  . TONSILLECTOMY  07/18/2011   Procedure: TONSILLECTOMY;  Surgeon: Christia Readingwight Bates, MD;  Location: [facility];  Service: ENT;  Laterality: Barbette OrBilateral;     Lavonte Palos, MD 10/10/18 1417

## 2018-10-10 NOTE — Discharge Instructions (Signed)
Intertrigo (in-tur-TRY-go) is inflammation caused by skin-to-skin friction, most often in warm, moist areas of the body, such as the groin, between folds of skin on the abdomen, under the breasts, under the arms or between toes. The affected skin may be sensitive or painful, and severe cases can result in oozing sores, cracked skin or bleeding.  Intertrigo usually clears up if you find a way to keep the affected areas as clean and dry as possible. Try wearing loosefitting clothing and using powder to reduce skin-to-skin friction in affected areas. Weight loss may be helpful as well.

## 2018-12-08 DIAGNOSIS — E669 Obesity, unspecified: Secondary | ICD-10-CM | POA: Diagnosis not present

## 2018-12-08 DIAGNOSIS — E559 Vitamin D deficiency, unspecified: Secondary | ICD-10-CM | POA: Diagnosis not present

## 2018-12-08 DIAGNOSIS — G4730 Sleep apnea, unspecified: Secondary | ICD-10-CM | POA: Diagnosis not present

## 2018-12-08 DIAGNOSIS — R7309 Other abnormal glucose: Secondary | ICD-10-CM | POA: Diagnosis not present

## 2018-12-08 DIAGNOSIS — Z0001 Encounter for general adult medical examination with abnormal findings: Secondary | ICD-10-CM | POA: Diagnosis not present

## 2018-12-08 DIAGNOSIS — E785 Hyperlipidemia, unspecified: Secondary | ICD-10-CM | POA: Diagnosis not present

## 2018-12-08 DIAGNOSIS — Z20828 Contact with and (suspected) exposure to other viral communicable diseases: Secondary | ICD-10-CM | POA: Diagnosis not present

## 2019-02-05 DIAGNOSIS — Z0001 Encounter for general adult medical examination with abnormal findings: Secondary | ICD-10-CM | POA: Diagnosis not present

## 2019-02-05 DIAGNOSIS — E785 Hyperlipidemia, unspecified: Secondary | ICD-10-CM | POA: Diagnosis not present

## 2019-02-05 DIAGNOSIS — R7309 Other abnormal glucose: Secondary | ICD-10-CM | POA: Diagnosis not present

## 2019-02-05 DIAGNOSIS — E559 Vitamin D deficiency, unspecified: Secondary | ICD-10-CM | POA: Diagnosis not present

## 2019-02-05 DIAGNOSIS — Z20828 Contact with and (suspected) exposure to other viral communicable diseases: Secondary | ICD-10-CM | POA: Diagnosis not present

## 2019-02-06 DIAGNOSIS — G4733 Obstructive sleep apnea (adult) (pediatric): Secondary | ICD-10-CM | POA: Diagnosis not present

## 2019-04-10 ENCOUNTER — Other Ambulatory Visit: Payer: Self-pay | Admitting: Physician Assistant

## 2019-04-10 DIAGNOSIS — U071 COVID-19: Secondary | ICD-10-CM

## 2019-04-10 DIAGNOSIS — Z03818 Encounter for observation for suspected exposure to other biological agents ruled out: Secondary | ICD-10-CM | POA: Diagnosis not present

## 2019-04-10 DIAGNOSIS — Z6841 Body Mass Index (BMI) 40.0 and over, adult: Secondary | ICD-10-CM

## 2019-04-10 DIAGNOSIS — Z20828 Contact with and (suspected) exposure to other viral communicable diseases: Secondary | ICD-10-CM | POA: Diagnosis not present

## 2019-04-10 MED ORDER — SODIUM CHLORIDE 0.9 % IV SOLN
700.0000 mg | Freq: Once | INTRAVENOUS | Status: AC
  Administered 2019-04-11: 700 mg via INTRAVENOUS
  Filled 2019-04-10: qty 700

## 2019-04-10 MED FILL — IVERMECTIN 3 MG TABLET: 3 | 5 days supply | Qty: 30 | Fill #0

## 2019-04-10 MED FILL — AZITHROMYCIN 500 MG TABLET: 500 | 7 days supply | Qty: 7 | Fill #0

## 2019-04-10 MED FILL — ONDANSETRON ODT 8 MG TABLET: 8 | 10 days supply | Qty: 20 | Fill #0

## 2019-04-10 NOTE — Progress Notes (Signed)
  I connected by phone with Peter Sanders on 04/10/2019 at 4:53 PM to discuss the potential use of an new treatment for mild to moderate COVID-19 viral infection in non-hospitalized patients.  This patient is a 29 y.o. male that meets the FDA criteria for Emergency Use Authorization of bamlanivimab or casirivimab\imdevimab.  Has a (+) direct SARS-CoV-2 viral test result  Has mild or moderate COVID-19   Is ? 31 years of age and weighs ? 40 kg  Is NOT hospitalized due to COVID-19  Is NOT requiring oxygen therapy or requiring an increase in baseline oxygen flow rate due to COVID-19  Is within 10 days of symptom onset  Has at least one of the high risk factor(s) for progression to severe COVID-19 and/or hospitalization as defined in EUA.  Specific high risk criteria : BMI >/= 35   I have spoken and communicated the following to the patient or parent/caregiver:  1. FDA has authorized the emergency use of bamlanivimab and casirivimab\imdevimab for the treatment of mild to moderate COVID-19 in adults and pediatric patients with positive results of direct SARS-CoV-2 viral testing who are 31 years of age and older weighing at least 40 kg, and who are at high risk for progressing to severe COVID-19 and/or hospitalization.  2. The significant known and potential risks and benefits of bamlanivimab and casirivimab\imdevimab, and the extent to which such potential risks and benefits are unknown.  3. Information on available alternative treatments and the risks and benefits of those alternatives, including clinical trials.  4. Patients treated with bamlanivimab and casirivimab\imdevimab should continue to self-isolate and use infection control measures (e.g., wear mask, isolate, social distance, avoid sharing personal items, clean and disinfect "high touch" surfaces, and frequent handwashing) according to CDC guidelines.   5. The patient or parent/caregiver has the option to accept or refuse  bamlanivimab or casirivimab\imdevimab .  After reviewing this information with the patient, The patient agreed to proceed with receiving the bamlanimivab infusion and will be provided a copy of the Fact sheet prior to receiving the infusion.  Peter Sanders 04/10/2019 4:53 PM

## 2019-04-11 ENCOUNTER — Encounter (HOSPITAL_COMMUNITY): Payer: Self-pay | Admitting: Family Medicine

## 2019-04-11 ENCOUNTER — Ambulatory Visit (HOSPITAL_COMMUNITY)
Admission: RE | Admit: 2019-04-11 | Discharge: 2019-04-11 | Disposition: A | Discharge status: Home / Self Care | Payer: 59 | Source: Ambulatory Visit | Attending: Pulmonary Disease | Admitting: Pulmonary Disease

## 2019-04-11 DIAGNOSIS — U071 COVID-19: Secondary | ICD-10-CM | POA: Diagnosis not present

## 2019-04-11 MED ORDER — EPINEPHRINE 0.3 MG/0.3ML IJ SOAJ: 0.3000 mg | Freq: Once | INTRAMUSCULAR | Status: DC | PRN

## 2019-04-11 MED ORDER — DIPHENHYDRAMINE HCL 50 MG/ML IJ SOLN: 50.0000 mg | Freq: Once | INTRAMUSCULAR | Status: DC | PRN

## 2019-04-11 MED ORDER — ALBUTEROL SULFATE HFA 108 (90 BASE) MCG/ACT IN AERS: 2.0000 | INHALATION_SPRAY | Freq: Once | RESPIRATORY_TRACT | Status: DC | PRN

## 2019-04-11 MED ORDER — METHYLPREDNISOLONE SODIUM SUCC 125 MG IJ SOLR: 125.0000 mg | Freq: Once | INTRAMUSCULAR | Status: DC | PRN

## 2019-04-11 MED ORDER — FAMOTIDINE IN NACL 20-0.9 MG/50ML-% IV SOLN: 20.0000 mg | Freq: Once | INTRAVENOUS | Status: DC | PRN

## 2019-04-11 MED ORDER — SODIUM CHLORIDE 0.9 % IV SOLN: INTRAVENOUS | Status: DC | PRN

## 2019-04-11 NOTE — Progress Notes (Signed)
  Diagnosis: COVID-19  Physician: wriht  Procedure: [facility] Med: bamlanivimab infusion - Provided patient with bamlanimivab fact sheet for patients, parents and caregivers prior to infusion.  Complications: No immediate complications noted.  Discharge: Discharged home   Shaune Spittle 04/11/2019

## 2019-04-11 NOTE — Discharge Instructions (Signed)

## 2019-12-06 DIAGNOSIS — Z20822 Contact with and (suspected) exposure to covid-19: Secondary | ICD-10-CM | POA: Diagnosis not present

## 2020-04-04 DIAGNOSIS — Z03818 Encounter for observation for suspected exposure to other biological agents ruled out: Secondary | ICD-10-CM | POA: Diagnosis not present

## 2020-04-04 DIAGNOSIS — Z20822 Contact with and (suspected) exposure to covid-19: Secondary | ICD-10-CM | POA: Diagnosis not present

## 2020-06-17 DIAGNOSIS — Z20822 Contact with and (suspected) exposure to covid-19: Secondary | ICD-10-CM | POA: Diagnosis not present

## 2022-06-29 ENCOUNTER — Other Ambulatory Visit (HOSPITAL_COMMUNITY): Payer: Self-pay

## 2022-06-29 MED ORDER — PHENTERMINE HCL 37.5 MG PO TABS
37.5000 mg | ORAL_TABLET | Freq: Every day | ORAL | 3 refills | Status: DC
  Filled 2022-06-29: qty 30, 30d supply, fill #0

## 2022-08-26 ENCOUNTER — Ambulatory Visit: Payer: Commercial Managed Care - PPO | Admitting: Gastroenterology

## 2022-09-21 NOTE — Progress Notes (Unsigned)
Referring Provider: Andi Devon, MD Primary Care Physician:  Andi Devon, MD Primary Gastroenterologist:  Dr. Marletta Lor  Chief Complaint  Patient presents with   Rectal Bleeding    Every now and then he has some mucous. No more bleeding. No pain     HPI:   Peter Sanders is a 32 y.o. male presenting today at the request of  Andi Devon, MD for painless rectal bleeding.   Today, patient states that he developed bright red blood surrounding the stool around the beginning of the year.  Symptoms would last for several days, then stop.  This continued off and on for several months, then seem to stop on its own.  Last episode was over a month ago.  Denies any rectal pain, abdominal pain, change in bowel habits.  On average, he has 1-3 normal bowel movements per day.  No hard stool.  He has noticed since rectal bleeding stopped, he has been having intermittent passage of small amounts of mucus per rectum.  No known hemorrhoids.   No other GI concerns.  Denies heartburn, nausea, vomiting, dysphagia.  Drives a truck for a living.  He has been working on weight loss recently.  Doing more exercise now. Walking about 5 miles a day. Going to the gym when home on the weekend. Down 25 lbs in 1.5 months.   No Fhx of colon cancer or IBD.     Past Medical History:  Diagnosis Date   Allergic drug rash 07/06/2011   after taking Percocet   Chronic tonsillitis 06/2011   GERD (gastroesophageal reflux disease)    OTC as needed   Morbid obesity ([facility])    Sleep apnea    snores, has apnea and wakes up gasping; states due to tonsil problem, has not had sleep study    Past Surgical History:  Procedure Laterality Date   HERNIA REPAIR     as an infant   lasix eye surgery Bilateral    TONSILLECTOMY  07/18/2011   Procedure: TONSILLECTOMY;  Surgeon: Christia Reading, MD;  Location: [facility];  Service: ENT;  Laterality: Bilateral;    Current Outpatient Medications   Medication Sig Dispense Refill   cholecalciferol (VITAMIN D3) 25 MCG (1000 UNIT) tablet Take 1,000 Units by mouth daily.     No current facility-administered medications for this visit.    Allergies as of 09/22/2022 - Review Complete 09/22/2022  Allergen Reaction Noted   Hydrocodone Hives 03/23/2011   Penicillins Hives 09/04/2006   Percocet [oxycodone-acetaminophen] Hives 07/12/2011   Sulfa antibiotics Hives 02/03/2012    Family History  Problem Relation Age of Onset   Diabetes Mother        type ll   Cancer Father        cirrhosis secondary to Hep C   Drug abuse Father    Hypertension Father    Drug abuse Brother    Cancer Maternal Grandmother        breast   Kidney disease Maternal Grandmother    Alcohol abuse Maternal Grandfather    Cancer Maternal Grandfather        prostate   Hypertension Paternal Grandfather    Diabetes Paternal Grandfather    Inflammatory bowel disease Neg Hx    Cancer - Colon Neg Hx     Social History   Socioeconomic History   Marital status: Married    Spouse name: Not on file   Number of children: Not on file   Years of education: Not  on file   Highest education level: Not on file  Occupational History   Not on file  Tobacco Use   Smoking status: Never   Smokeless tobacco: Never  Substance and Sexual Activity   Alcohol use: Yes    Comment: socially   Drug use: No   Sexual activity: Not on file  Other Topics Concern   Not on file  Social History Narrative   Not on file   Social Determinants of Health   Financial Resource Strain: Not on file  Food Insecurity: Not on file  Transportation Needs: Not on file  Physical Activity: Not on file  Stress: Not on file  Social Connections: Not on file  Intimate Partner Violence: Not on file    Review of Systems: Gen: Denies any fever, chills, cold or flulike symptoms, presyncope, syncope. CV: Denies chest pain, heart palpitations. Resp: Denies shortness of breath, cough. GI: See  HPI GU : Denies urinary burning, urinary frequency, urinary hesitancy MS: Denies joint pain. Derm: Denies rash. Psych: Denies depression, anxiety. Heme: See HPI  Physical Exam: BP 139/83 (BP Location: Left Arm, Patient Position: Sitting, Cuff Size: Large)   Pulse 61   Temp 97.7 F (36.5 C) (Temporal)   Ht 5\' 9"  (1.753 m)   Wt (!) 455 lb (206.4 kg)   SpO2 98%   BMI 67.19 kg/m  General:   Alert and oriented. Pleasant and cooperative. Well-nourished and well-developed.  Head:  Normocephalic and atraumatic. Eyes:  Without icterus, sclera clear and conjunctiva pink.  Ears:  Normal auditory acuity. Lungs:  Clear to auscultation bilaterally. No wheezes, rales, or rhonchi. No distress.  Heart:  S1, S2 present without murmurs appreciated.  Abdomen:  +BS, soft, non-tender and non-distended. No HSM noted. No guarding or rebound. No masses appreciated.  Rectal:  Deferred  Msk:  Symmetrical without gross deformities. Normal posture. Extremities:  Without edema. Neurologic:  Alert and  oriented x4;  grossly normal neurologically. Skin:  Intact without significant lesions or rashes. Psych:  Normal mood and affect.    Assessment:  32 year old male presenting today for further evaluation of rectal bleeding. Reports intermittent episodes of bright red blood surrounding stool since the beginning of the year with spontaneous resolution a little over 1 month ago. Now with intermittent passage of mucous per rectum. No abdominal pain, constipation, diarrhea, rectal pain, or known hemorrhoids. Has been intentionally losing weight over the last 1.5 months. No Fhx of colon cancer or IBD.   Etiology of symptoms is not clear. 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   Plan:  Proceed with colonoscopy with propofol by Dr. Marletta Lor in near future. The risks, benefits, and  alternatives have been discussed with the patient in detail. The patient states understanding and desires to proceed.  ASA 3 Update CBC.  OK to try preparation H BID x 7-10 days for possible hemorrhoids.   Follow-up after procedure.    Ermalinda Memos, PA-C [facility] 09/22/2022

## 2022-09-22 ENCOUNTER — Ambulatory Visit (INDEPENDENT_AMBULATORY_CARE_PROVIDER_SITE_OTHER): Payer: Commercial Managed Care - PPO | Admitting: Gastroenterology

## 2022-09-22 ENCOUNTER — Encounter: Payer: Self-pay | Admitting: Gastroenterology

## 2022-09-22 VITALS — BP 139/83 | HR 61 | Temp 97.7°F | Ht 69.0 in | Wt >= 6400 oz

## 2022-09-22 DIAGNOSIS — K625 Hemorrhage of anus and rectum: Secondary | ICD-10-CM

## 2022-09-22 NOTE — Patient Instructions (Signed)
Please have blood work completed at [facility] to recheck your hemoglobin.  We will get you scheduled for colonoscopy with Dr. Marletta Lor in the near future.   You may try using Preparation H for possible hemorrhoids that could be contributing to the intermittent passage of mucus per rectum.  Apply Preparation H twice daily per rectum for 7-10 days, then stop.  I will follow-up with you in the office after your procedure.  Do not hesitate to call sooner if you have questions or concerns.  It was very nice to meet you today!  Ermalinda Memos, PA-C [facility]

## 2022-10-10 ENCOUNTER — Other Ambulatory Visit: Payer: Self-pay | Admitting: *Deleted

## 2022-10-10 ENCOUNTER — Encounter: Payer: Self-pay | Admitting: *Deleted

## 2022-10-10 MED ORDER — PEG 3350-KCL-NA BICARB-NACL 420 G PO SOLR: 4000.0000 mL | Freq: Once | ORAL | 0 refills | Status: AC

## 2022-10-11 ENCOUNTER — Encounter: Payer: Self-pay | Admitting: *Deleted

## 2022-11-11 ENCOUNTER — Encounter: Payer: Self-pay | Admitting: *Deleted

## 2022-11-11 ENCOUNTER — Telehealth: Payer: Self-pay | Admitting: *Deleted

## 2022-11-11 NOTE — Telephone Encounter (Signed)
Pt called to reschedule his procedure on 11/21/22 due to having to take his mother to her procedure. Pt has been rescheduled to 12/26/22 at 8:15 am. Updated instructions sent via MyChart.

## 2022-11-14 ENCOUNTER — Encounter: Payer: Self-pay | Admitting: *Deleted

## 2022-11-17 ENCOUNTER — Encounter (HOSPITAL_COMMUNITY): Payer: PRIVATE HEALTH INSURANCE

## 2022-12-19 NOTE — Patient Instructions (Signed)
Shahmeer Leorn Faist  12/19/2022     @PREFPERIOPPHARMACY @   Your procedure is scheduled on  12/26/2022.   Report to [facility] at  [phone]  A.M.   Call this number if you have problems the morning of surgery:  [phone]  If you experience any cold or flu symptoms such as cough, fever, chills, shortness of breath, etc. between now and your scheduled surgery, please notify us at the above number.   Remember:  Follow the diet and prep instructions given to you by the office.   You may drink clear liquids until 0445 am on 12/26/2022.    Clear liquids allowed are:                    Water, Juice (No red color; non-citric and without pulp; diabetics please choose diet or no sugar options), Carbonated beverages (diabetics please choose diet or no sugar options), Clear Tea (No creamer, milk, or cream, including half & half and powdered creamer), Black Coffee Only (No creamer, milk or cream, including half & half and powdered creamer), and Clear Sports drink (No red color; diabetics please choose diet or no sugar options)    Take these medicines the morning of surgery with A SIP OF WATER                                                  None.    Do not wear jewelry, make-up or nail polish, including gel polish,  artificial nails, or any other type of covering on natural nails (fingers and  toes).  Do not wear lotions, powders, or perfumes, or deodorant.  Do not shave 48 hours prior to surgery.  Men may shave face and neck.  Do not bring valuables to the hospital.  [facility] is not responsible for any belongings or valuables.  Contacts, dentures or bridgework may not be worn into surgery.  Leave your suitcase in the car.  After surgery it may be brought to your room.  For patients admitted to the hospital, discharge time will be determined by your treatment team.  Patients discharged the day of surgery will not be allowed to drive home and must have someone with them for 24  hours.    Special instructions:   DO NOT smoke tobacco or vape for 24 hours before your procedure.  Please read over the following fact sheets that you were given. Anesthesia Post-op Instructions and Care and Recovery After Surgery      Colonoscopy, Adult, Care After The following information offers guidance on how to care for yourself after your procedure. Your health care provider may also give you more specific instructions. If you have problems or questions, contact your health care provider. What can I expect after the procedure? After the procedure, it is common to have: A small amount of blood in your stool for 24 hours after the procedure. Some gas. Mild cramping or bloating of your abdomen. Follow these instructions at home: Eating and drinking  Drink enough fluid to keep your urine pale yellow. Follow instructions from your health care provider about eating or drinking restrictions. Resume your normal diet as told by your health care provider. Avoid heavy or fried foods that are hard to digest. Activity Rest as told by your health care provider. Avoid sitting  for a long time without moving. Get up to take short walks every 1-2 hours. This is important to improve blood flow and breathing. Ask for help if you feel weak or unsteady. Return to your normal activities as told by your health care provider. Ask your health care provider what activities are safe for you. Managing cramping and bloating  Try walking around when you have cramps or feel bloated. If directed, apply heat to your abdomen as told by your health care provider. Use the heat source that your health care provider recommends, such as a moist heat pack or a heating pad. Place a towel between your skin and the heat source. Leave the heat on for 20-30 minutes. Remove the heat if your skin turns bright red. This is especially important if you are unable to feel pain, heat, or cold. You have a greater risk of getting  burned. General instructions If you were given a sedative during the procedure, it can affect you for several hours. Do not drive or operate machinery until your health care provider says that it is safe. For the first 24 hours after the procedure: Do not sign important documents. Do not drink alcohol. Do your regular daily activities at a slower pace than normal. Eat soft foods that are easy to digest. Take over-the-counter and prescription medicines only as told by your health care provider. Keep all follow-up visits. This is important. Contact a health care provider if: You have blood in your stool 2-3 days after the procedure. Get help right away if: You have more than a small spotting of blood in your stool. You have large blood clots in your stool. You have swelling of your abdomen. You have nausea or vomiting. You have a fever. You have increasing pain in your abdomen that is not relieved with medicine. These symptoms may be an emergency. Get help right away. Call 911. Do not wait to see if the symptoms will go away. Do not drive yourself to the hospital. Summary After the procedure, it is common to have a small amount of blood in your stool. You may also have mild cramping and bloating of your abdomen. If you were given a sedative during the procedure, it can affect you for several hours. Do not drive or operate machinery until your health care provider says that it is safe. Get help right away if you have a lot of blood in your stool, nausea or vomiting, a fever, or increased pain in your abdomen. This information is not intended to replace advice given to you by your health care provider. Make sure you discuss any questions you have with your health care provider. Document Revised: 02/22/2022 Document Reviewed: 09/02/2020 Elsevier Patient Education  2024 [employer] Monitored Anesthesia Care, Care After The following information offers guidance on how to care for yourself  after your procedure. Your health care provider may also give you more specific instructions. If you have problems or questions, contact your health care provider. What can I expect after the procedure? After the procedure, it is common to have: Tiredness. Little or no memory about what happened during or after the procedure. Impaired judgment when it comes to making decisions. Nausea or vomiting. Some trouble with balance. Follow these instructions at home: For the time period you were told by your health care provider:  Rest. Do not participate in activities where you could fall or become injured. Do not drive or use machinery. Do not drink alcohol. Do not take  sleeping pills or medicines that cause drowsiness. Do not make important decisions or sign legal documents. Do not take care of children on your own. Medicines Take over-the-counter and prescription medicines only as told by your health care provider. If you were prescribed antibiotics, take them as told by your health care provider. Do not stop using the antibiotic even if you start to feel better. Eating and drinking Follow instructions from your health care provider about what you may eat and drink. Drink enough fluid to keep your urine pale yellow. If you vomit: Drink clear fluids slowly and in small amounts as you are able. Clear fluids include water, ice chips, low-calorie sports drinks, and fruit juice that has water added to it (diluted fruit juice). Eat light and bland foods in small amounts as you are able. These foods include bananas, applesauce, rice, lean meats, toast, and crackers. General instructions  Have a responsible adult stay with you for the time you are told. It is important to have someone help care for you until you are awake and alert. If you have sleep apnea, surgery and some medicines can increase your risk for breathing problems. Follow instructions from your health care provider about wearing your  sleep device: When you are sleeping. This includes during daytime naps. While taking prescription pain medicines, sleeping medicines, or medicines that make you drowsy. Do not use any products that contain nicotine or tobacco. These products include cigarettes, chewing tobacco, and vaping devices, such as e-cigarettes. If you need help quitting, ask your health care provider. Contact a health care provider if: You feel nauseous or vomit every time you eat or drink. You feel light-headed. You are still sleepy or having trouble with balance after 24 hours. You get a rash. You have a fever. You have redness or swelling around the IV site. Get help right away if: You have trouble breathing. You have new confusion after you get home. These symptoms may be an emergency. Get help right away. Call 911. Do not wait to see if the symptoms will go away. Do not drive yourself to the hospital. This information is not intended to replace advice given to you by your health care provider. Make sure you discuss any questions you have with your health care provider. Document Revised: 06/07/2021 Document Reviewed: 06/07/2021 Elsevier Patient Education  2024 [employer].

## 2022-12-20 ENCOUNTER — Encounter: Payer: Self-pay | Admitting: *Deleted

## 2022-12-20 NOTE — Telephone Encounter (Signed)
Pt called and said he wouldn't have a ride on 12/26/22 and needed to reschedule.   Pt has been rescheduled until 01/16/23 at 2:15 pm, updated instructions mailed.

## 2022-12-21 ENCOUNTER — Encounter (HOSPITAL_COMMUNITY)
Admission: RE | Admit: 2022-12-21 | Discharge: 2022-12-21 | Disposition: A | Discharge status: Home / Self Care | Payer: PRIVATE HEALTH INSURANCE | Source: Ambulatory Visit | Attending: Internal Medicine | Admitting: Internal Medicine

## 2023-01-10 NOTE — Patient Instructions (Signed)
Peter Sanders        12/19/2024                 @PREFPERIOPPHARMACY @          Your procedure is scheduled on  01/16/2023.          Report to [facility] at  [place]  [facility].          Call this number if you have problems the morning of surgery:        [phone]   If you experience any cold or flu symptoms such as cough, fever, chills, shortness of breath, etc. between now and your scheduled surgery, please notify us at the above number.          Remember:        Follow the diet and prep instructions given to you by the office.          You may drink clear liquids until 1015 AM   Clear liquids allowed are:                    Water, Juice (No red color; non-citric and without pulp; diabetics please choose diet or no sugar options), Carbonated beverages (diabetics please choose diet or no sugar options), Clear Tea (No creamer, milk, or cream, including half & half and powdered creamer), Black Coffee Only (No creamer, milk or cream, including half & half and powdered creamer), and Clear Sports drink (No red color; diabetics please choose diet or no sugar options)                Take these medicines the morning of surgery with A SIP OF WATER                                                   None.                Do not wear jewelry, make-up or nail polish, including gel polish,        artificial nails, or any other type of covering on natural nails (fingers and        toes).        Do not wear lotions, powders, or perfumes, or deodorant.        Do not shave 48 hours prior to surgery.  Men may shave face and neck.        Do not bring valuables to the hospital.        [facility] is not responsible for any belongings or valuables.   Contacts, dentures or bridgework may not be worn into surgery.  Leave your suitcase in the car.  After surgery it may be brought to your room.   For patients admitted to the hospital, discharge time will be determined by your treatment team.   Patients  discharged the day of surgery will not be allowed to drive home and must have someone with them for 24 hours.      Special instructions:   DO NOT smoke tobacco or vape for 24 hours before your procedure.   Please read over the following fact sheets that you were given. Anesthesia Post-op Instructions and Care and Recovery After Surgery                    Colonoscopy, Adult,  Care After The following information offers guidance on how to care for yourself after your procedure. Your health care provider may also give you more specific instructions. If you have problems or questions, contact your health care provider. What can I expect after the procedure? After the procedure, it is common to have: A small amount of blood in your stool for 24 hours after the procedure. Some gas. Mild cramping or bloating of your abdomen. Follow these instructions at home: Eating and drinking  Drink enough fluid to keep your urine pale yellow. Follow instructions from your health care provider about eating or drinking restrictions. Resume your normal diet as told by your health care provider. Avoid heavy or fried foods that are hard to digest. Activity Rest as told by your health care provider. Avoid sitting for a long time without moving. Get up to take short walks every 1-2 hours. This is important to improve blood flow and breathing. Ask for help if you feel weak or unsteady. Return to your normal activities as told by your health care provider. Ask your health care provider what activities are safe for you. Managing cramping and bloating  Try walking around when you have cramps or feel bloated. If directed, apply heat to your abdomen as told by your health care provider. Use the heat source that your health care provider recommends, such as a moist heat pack or a heating pad. Place a towel between your skin and the heat source. Leave the heat on for 20-30 minutes. Remove the heat if your skin turns  bright red. This is especially important if you are unable to feel pain, heat, or cold. You have a greater risk of getting burned. General instructions If you were given a sedative during the procedure, it can affect you for several hours. Do not drive or operate machinery until your health care provider says that it is safe. For the first 24 hours after the procedure: Do not sign important documents. Do not drink alcohol. Do your regular daily activities at a slower pace than normal. Eat soft foods that are easy to digest. Take over-the-counter and prescription medicines only as told by your health care provider. Keep all follow-up visits. This is important. Contact a health care provider if: You have blood in your stool 2-3 days after the procedure. Get help right away if: You have more than a small spotting of blood in your stool. You have large blood clots in your stool. You have swelling of your abdomen. You have nausea or vomiting. You have a fever. You have increasing pain in your abdomen that is not relieved with medicine. These symptoms may be an emergency. Get help right away. Call 911. Do not wait to see if the symptoms will go away. Do not drive yourself to the hospital. Summary After the procedure, it is common to have a small amount of blood in your stool. You may also have mild cramping and bloating of your abdomen. If you were given a sedative during the procedure, it can affect you for several hours. Do not drive or operate machinery until your health care provider says that it is safe. Get help right away if you have a lot of blood in your stool, nausea or vomiting, a fever, or increased pain in your abdomen. This information is not intended to replace advice given to you by your health care provider. Make sure you discuss any questions you have with your health care provider. Document Revised: 02/22/2022 Document  Reviewed: 09/02/2020 Elsevier Patient Education  2024  [employer] Monitored Anesthesia Care, Care After The following information offers guidance on how to care for yourself after your procedure. Your health care provider may also give you more specific instructions. If you have problems or questions, contact your health care provider. What can I expect after the procedure? After the procedure, it is common to have: Tiredness. Little or no memory about what happened during or after the procedure. Impaired judgment when it comes to making decisions. Nausea or vomiting. Some trouble with balance. Follow these instructions at home: For the time period you were told by your health care provider:  Rest. Do not participate in activities where you could fall or become injured. Do not drive or use machinery. Do not drink alcohol. Do not take sleeping pills or medicines that cause drowsiness. Do not make important decisions or sign legal documents. Do not take care of children on your own. Medicines Take over-the-counter and prescription medicines only as told by your health care provider. If you were prescribed antibiotics, take them as told by your health care provider. Do not stop using the antibiotic even if you start to feel better. Eating and drinking Follow instructions from your health care provider about what you may eat and drink. Drink enough fluid to keep your urine pale yellow. If you vomit: Drink clear fluids slowly and in small amounts as you are able. Clear fluids include water, ice chips, low-calorie sports drinks, and fruit juice that has water added to it (diluted fruit juice). Eat light and bland foods in small amounts as you are able. These foods include bananas, applesauce, rice, lean meats, toast, and crackers. General instructions  Have a responsible adult stay with you for the time you are told. It is important to have someone help care for you until you are awake and alert. If you have sleep apnea, surgery and some  medicines can increase your risk for breathing problems. Follow instructions from your health care provider about wearing your sleep device: When you are sleeping. This includes during daytime naps. While taking prescription pain medicines, sleeping medicines, or medicines that make you drowsy. Do not use any products that contain nicotine or tobacco. These products include cigarettes, chewing tobacco, and vaping devices, such as e-cigarettes. If you need help quitting, ask your health care provider. Contact a health care provider if: You feel nauseous or vomit every time you eat or drink. You feel light-headed. You are still sleepy or having trouble with balance after 24 hours. You get a rash. You have a fever. You have redness or swelling around the IV site. Get help right away if: You have trouble breathing. You have new confusion after you get home. These symptoms may be an emergency. Get help right away. Call 911. Do not wait to see if the symptoms will go away. Do not drive yourself to the hospital. This information is not intended to replace advice given to you by your health care provider. Make sure you discuss any questions you have with your health care provider. Document Revised: 06/07/2021 Document Reviewed: 06/07/2021 Elsevier Patient Education  2024 [employer].

## 2023-01-10 NOTE — Telephone Encounter (Signed)
Called pts insurance for PA.  Authorization # [id number] DOS: 01/16/23-03/17/23

## 2023-01-11 ENCOUNTER — Encounter (HOSPITAL_COMMUNITY)
Admission: RE | Admit: 2023-01-11 | Discharge: 2023-01-11 | Disposition: A | Discharge status: Home / Self Care | Payer: PRIVATE HEALTH INSURANCE | Source: Ambulatory Visit | Attending: Internal Medicine | Admitting: Internal Medicine

## 2023-01-11 ENCOUNTER — Encounter (HOSPITAL_COMMUNITY): Payer: Self-pay

## 2023-01-11 DIAGNOSIS — K625 Hemorrhage of anus and rectum: Secondary | ICD-10-CM

## 2023-01-16 ENCOUNTER — Encounter (HOSPITAL_COMMUNITY): Admission: RE | Payer: Self-pay | Source: Home / Self Care

## 2023-01-16 ENCOUNTER — Ambulatory Visit (HOSPITAL_COMMUNITY)
Admission: RE | Admit: 2023-01-16 | Payer: PRIVATE HEALTH INSURANCE | Source: Home / Self Care | Admitting: Internal Medicine

## 2023-01-16 SURGERY — COLONOSCOPY WITH PROPOFOL
Anesthesia: Monitor Anesthesia Care
